# Patient Record
Sex: Female | Born: 1990 | Race: Black or African American | Hispanic: No | Marital: Single | Smoking: Former smoker
Health system: Southern US, Community
[De-identification: ages and names within clinical notes are randomized; demographics above are authoritative.]

## PROBLEM LIST (undated history)

## (undated) ENCOUNTER — Emergency Department (HOSPITAL_COMMUNITY): Admission: EM | Payer: Self-pay | Source: Home / Self Care

## (undated) ENCOUNTER — Inpatient Hospital Stay (HOSPITAL_COMMUNITY): Payer: Self-pay

## (undated) DIAGNOSIS — Z789 Other specified health status: Secondary | ICD-10-CM

## (undated) HISTORY — PX: MOUTH SURGERY: SHX715

---
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
## 2010-01-22 ENCOUNTER — Encounter (INDEPENDENT_AMBULATORY_CARE_PROVIDER_SITE_OTHER): Payer: Self-pay | Admitting: Nurse Practitioner

## 2010-01-30 ENCOUNTER — Ambulatory Visit: Payer: Self-pay | Admitting: Nurse Practitioner

## 2010-01-30 ENCOUNTER — Other Ambulatory Visit: Admission: RE | Admit: 2010-01-30 | Discharge: 2010-01-30 | Payer: Self-pay | Admitting: Internal Medicine

## 2010-01-30 LAB — CONVERTED CEMR LAB
Blood in Urine, dipstick: NEGATIVE
Ketones, urine, test strip: NEGATIVE
Nitrite: NEGATIVE
Protein, U semiquant: NEGATIVE
Specific Gravity, Urine: 1.015
Urobilinogen, UA: 1
WBC Urine, dipstick: NEGATIVE

## 2010-02-03 ENCOUNTER — Encounter (INDEPENDENT_AMBULATORY_CARE_PROVIDER_SITE_OTHER): Payer: Self-pay | Admitting: Nurse Practitioner

## 2010-02-03 DIAGNOSIS — D649 Anemia, unspecified: Secondary | ICD-10-CM

## 2010-02-03 LAB — CONVERTED CEMR LAB
Basophils Absolute: 0 10*3/uL (ref 0.0–0.1)
Chlamydia, DNA Probe: NEGATIVE
Eosinophils Relative: 1 % (ref 0–5)
GC Probe Amp, Genital: NEGATIVE
HCT: 35.8 % — ABNORMAL LOW (ref 36.0–46.0)
Hemoglobin: 12 g/dL (ref 12.0–15.0)
Lymphocytes Relative: 51 % — ABNORMAL HIGH (ref 12–46)
Lymphs Abs: 2.5 10*3/uL (ref 0.7–4.0)
Monocytes Absolute: 0.7 10*3/uL (ref 0.1–1.0)
Neutro Abs: 1.6 10*3/uL — ABNORMAL LOW (ref 1.7–7.7)
RBC: 5.6 M/uL — ABNORMAL HIGH (ref 3.87–5.11)
RDW: 15 % (ref 11.5–15.5)
WBC: 4.9 10*3/uL (ref 4.0–10.5)

## 2010-04-02 ENCOUNTER — Ambulatory Visit: Payer: Self-pay | Admitting: Nurse Practitioner

## 2010-04-02 DIAGNOSIS — B373 Candidiasis of vulva and vagina: Secondary | ICD-10-CM

## 2010-04-02 DIAGNOSIS — B3731 Acute candidiasis of vulva and vagina: Secondary | ICD-10-CM | POA: Insufficient documentation

## 2010-04-02 LAB — CONVERTED CEMR LAB: KOH Prep: NEGATIVE

## 2010-04-21 ENCOUNTER — Emergency Department (HOSPITAL_COMMUNITY): Admission: EM | Admit: 2010-04-21 | Discharge: 2010-04-21 | Payer: Self-pay | Admitting: Emergency Medicine

## 2010-07-28 NOTE — Letter (Signed)
Summary: IMMUNIZATION RECORD  IMMUNIZATION RECORD   Imported By: Arta Bruce 02/18/2010 14:50:39  _____________________________________________________________________  External Attachment:    Type:   Image     Comment:   External Document

## 2010-07-28 NOTE — Letter (Signed)
Summary: RECORDS RECEIVED/[facility]  RECORDS RECEIVED/[facility]   Imported By: Arta Bruce 01/22/2010 09:52:46  _____________________________________________________________________  External Attachment:    Type:   Image     Comment:   External Document

## 2010-07-28 NOTE — Assessment & Plan Note (Signed)
Summary: NEW - Establish care   Vital Signs:  Patient profile:   20 year old female LMP:     11/2009 Height:      70.25 inches Weight:      212.2 pounds BMI:     30.34 Temp:     98.2 degrees F oral Pulse rate:   62 / minute Pulse rhythm:   regular Resp:     16 per minute BP sitting:   101 / 66  (left arm) Cuff size:   regular  Vitals Entered By: Levon Hedger (December 31, 2009 3:05 PM) CC: new establish Is Patient Diabetic? No Pain Assessment Patient in pain? no       Does patient need assistance? Functional Status Self care Ambulation Normal LMP (date): 11/2009     Enter LMP: 11/2009   CC:  new establish.  History of Present Illness:  Pt into the office to establish care. Pt previously seen at [facility]  PMH - none PSH - none  Social - pt graduated from high school in 10/2008.  She has not been doing anything over the past year but trying to help her mother who has multiple illnesses (cva, diabetes, htn) 3rd of 5 children lives with mother, father is deceased  No CPE in over 1 year - need to get immunization record from [facility]  No acute issues today - only wanted to establish care today  Habits & Providers  Alcohol-Tobacco-Diet     Alcohol drinks/day: 0     Tobacco Status: never  Exercise-Depression-Behavior     Does Patient Exercise: no     Exercise Counseling: to improve exercise regimen     Drug Use: marijuanna     Drug Use Counseling: advised to quit  Medications Prior to Update: 1)  None  Allergies (verified): No Known Drug Allergies  Family History: mother - diabetes, htn, CVA father - (deceased - GSW) 5 siblings - all healthy  Social History: No children tobacco - none ETOH - none Drug - marijuanaSmoking Status:  never Drug Use:  marijuanna Does Patient Exercise:  no  Review of Systems General:  Complains of fatigue; denies fever; however pt admits that she is bored during the day so just sleeps because she does not  have anything else to do. Eyes:  Denies blurring. CV:  Denies chest pain or discomfort. Resp:  Denies cough. GI:  Denies abdominal pain, nausea, and vomiting.  Physical Exam  General:  alert.   Head:  normocephalic.   Lungs:  normal breath sounds.   Heart:  normal rate and regular rhythm.   Msk:  up to the exam table Neurologic:  alert & oriented X3 and gait normal.     Impression & Recommendations:  Problem # 1:  FATIGUE (ICD-780.79) advised pt that she needs to become active during the day perhaps this is the cause for fatigue will check labs on next visit will need to get chart from [facility] to determine what immunizations pt needs  Patient Instructions: 1)  NEW Patient - need to complete new patient package. 2)  Need chart from [facility] - sign a release of records 3)  Go to [facility] to see if you can enroll in classes.  You need to start school or start working so that you will have something to do during the day 4)  Schedule an appointment for a complete physical exam 5)  Do not eat before this appointment 6)  You will need fasting  labs, PAP, PHQ-9, u/a

## 2010-07-28 NOTE — Letter (Signed)
Summary: *HSN Results Follow up  [facility]  [address] [place], [place] [address]   Phone: [phone]  Fax: [phone]      02/03/2010   [facility] [address] [place], [place]  [address]   Dear  [name],                            ____S.Drinkard,FNP   ____D. Gore,FNP       ____B. McPherson,MD   ____V. Rankins,MD    ____E. Mulberry,MD    _X___N. Daphine Deutscher, FNP  ____D. Reche Dixon, MD    ____K. Philipp Deputy, MD    ____Other     This letter is to inform you that your recent test(s):  ___X____Pap Smear    ___X____Lab Test     _______X-ray    ___X____ is within acceptable limits  ___X____ requires a medication change    Comments:  Labs done during recent office visit shows that you are slightly anemic. You can take a multivitamin by mouth daily.  This is available over the counter.  Pap Smear results ________________________.      _________________________________________________________ If you have any questions, please contact our office [phone].                    Sincerely,    [name] FNP [facility]

## 2010-07-28 NOTE — Assessment & Plan Note (Signed)
Summary: Complete Physical Exam   Vital Signs:  Patient profile:   20 year old female Menstrual status:  regular LMP:     01/05/2010 Weight:      210.7 pounds BMI:     30.13 BSA:     2.14 Temp:     98.0 degrees F oral Pulse rate:   70 / minute Pulse rhythm:   regular Resp:     16 per minute BP sitting:   116 / 73  (left arm) Cuff size:   regular  Vitals Entered By: Levon Hedger (January 30, 2010 3:07 PM)  Nutrition Counseling: Patient's BMI is greater than 25 and therefore counseled on weight management options. CC: CPP Is Patient Diabetic? No Pain Assessment Patient in pain? yes     Location: back  Does patient need assistance? Functional Status Self care Ambulation Normal LMP (date): 01/05/2010 LMP - Character: normal     Menstrual Status regular Enter LMP: 01/05/2010   CC:  CPP.  History of Present Illness:  Pt into the office for a complete physical exam  PAP - Pt has never had a pap smear before. Pt is sexually active.  She uses condoms for birth control Menses - monthly.  Mammogram - no self breast exams at home remote family history of breast cancer but pt is unsure who - she will poll mother regarding family history  Dental - she maintains routine dental exams at [facility] every 6 months  Optho - no glasses or contacts.  She does have some problems with distance vision.    Habits & Providers  Alcohol-Tobacco-Diet     Alcohol drinks/day: 0     Tobacco Status: never  Exercise-Depression-Behavior     Does Patient Exercise: no     Exercise Counseling: to improve exercise regimen     Have you felt down or hopeless? no     Have you felt little pleasure in things? no     Drug Use: marijuanna  Comments: PHQ-9 score = 7  Medications Prior to Update: 1)  None  Allergies (verified): No Known Drug Allergies  Review of Systems General:  Denies fever. Eyes:  Denies blurring. ENT:  Denies earache. CV:  Denies chest pain or  discomfort. Resp:  Denies cough. GI:  Denies abdominal pain, nausea, and vomiting. GU:  Denies discharge. MS:  Denies joint pain. Derm:  Denies dryness. Neuro:  Denies headaches. Psych:  Denies anxiety and depression.  Physical Exam  General:  alert.   Head:  normocephalic.   Eyes:  pupils equal, pupils round, and pupils reactive to light.   Ears:  R ear normal and L ear normal.  bil TM with bony landmarks Nose:  no nasal discharge.   Mouth:  pharynx pink and moist and fair dentition.   Neck:  supple.   Breasts:  bil upper outer breast with dense tissue Lungs:  normal breath sounds.   Heart:  normal rate and regular rhythm.   Abdomen:  soft, non-tender, and normal bowel sounds.   Rectal:  defer Msk:  up to the exam table Pulses:  R radial normal and R dorsalis pedis normal.   Extremities:  no edema Neurologic:  alert & oriented X3.   Skin:  color normal.   Psych:  Oriented X3.    Pelvic Exam  Vulva:      normal appearance.   Urethra and Bladder:      Urethra--normal.   Vagina:      physiologic discharge.  Cervix:      midposition.   Uterus:      smooth.   Adnexa:      normal.      Impression & Recommendations:  Problem # 1:  ROUTINE GYNECOLOGICAL EXAMINATION (ICD-V72.31) PAP done rec optho and dental exam PHQ-9 score = 7 self breast exam placcard given Orders: KOH/ WET Mount [phone]) Pap Smear, Thin Prep ( Collection of) (U0454) UA Dipstick w/o Micro (manual) (09811) Rapid HIV  (92370)  Problem # 2:  SCREENING EXAMINATION FOR VENEREAL DISEASE (ICD-V74.5) advised pt about safe sex practices condoms given to pt Orders: T- GC Chlamydia (91478)  Problem # 3:  FATIGUE (ICD-780.79)  Orders: T-CBC w/Diff (29562-13086) Rapid HIV  (57846)  Other Orders: State-TD Vaccine 7 yrs. & > IM (96295M) State- HPV Vaccine/ 3 dose sch IM (84132G) Admin 1st Vaccine (40102) Admin of Any Addtl Vaccine (72536) Admin 1st Vaccine (State) (64403K) Admin of Any Addtl  Vaccine (State) (74259D)  Patient Instructions: 1)  Make an appointment for an eye exam 2)  Make an appointment for a dental exam 3)  Remember that only condom use can help protect against sexually transmitted disease.  You will be checked for these today. 4)  You have been given a tetanus today. It will be good for 10 years. 5)  Follow up in 2 months for Gardisil #2 - nurse visit 6)  Follow up in 6 months for Gardisil #3 - nurse visit  Immunization History:  Hepatitis B Immunization History:    Hepatitis B # 1:  historical (02/24/1995)    Hepatitis B # 2:  historical (04/01/2003)  DPT Immunization History:    DPT # 1:  historical (03/25/1992)    DPT # 2:  historical (05/30/1992)    DPT # 3:  historical (07/31/1992)    DPT # 4:  historical (09/30/1994)  HIB Immunization History:    HIB # 1:  historical (03/25/1992)    HIB # 2:  historical (05/30/1992)  Polio Immunization History:    Polio # 1:  historical (03/25/1992)    Polio # 2:  historical (05/30/1992)    Polio # 3:  historical (07/31/1992)  MMR Immunization History:    MMR # 1:  historical (05/30/1992)    MMR # 2:  mmrv (state) (02/24/1995)  Laboratory Results   Urine Tests  Date/Time Received: January 30, 2010 3:24 PM   Routine Urinalysis   Color: lt. yellow Appearance: Clear Glucose: negative   (Normal Range: Negative) Bilirubin: negative   (Normal Range: Negative) Ketone: negative   (Normal Range: Negative) Spec. Gravity: 1.015   (Normal Range: 1.003-1.035) Blood: negative   (Normal Range: Negative) pH: 7.5   (Normal Range: 5.0-8.0) Protein: negative   (Normal Range: Negative) Urobilinogen: 1.0   (Normal Range: 0-1) Nitrite: negative   (Normal Range: Negative) Leukocyte Esterace: negative   (Normal Range: Negative)    Date/Time Received: January 30, 2010 3:53 PM   Wet Mount/KOH Source: vaginal WBC/hpf: 1-5 Bacteria/hpf: rare Clue cells/hpf: none Yeast/hpf: none Trichomonas/hpf:  none   Laboratory Results   Urine Tests    Routine Urinalysis   Color: lt. yellow Appearance: Clear Glucose: negative   (Normal Range: Negative) Bilirubin: negative   (Normal Range: Negative) Ketone: negative   (Normal Range: Negative) Spec. Gravity: 1.015   (Normal Range: 1.003-1.035) Blood: negative   (Normal Range: Negative) pH: 7.5   (Normal Range: 5.0-8.0) Protein: negative   (Normal Range: Negative) Urobilinogen: 1.0   (Normal Range: 0-1) Nitrite:  negative   (Normal Range: Negative) Leukocyte Esterace: negative   (Normal Range: Negative)      Wet Mount Wet Mount KOH: Negative     Tetanus/Td Vaccine    Vaccine Type: Tdap (State)    Site: left deltoid    Mfr: GlaxoSmithKline    Dose: 0.5 ml    Route: IM    Given by: Levon Hedger    Exp. Date: 03/15/2011    Lot #: [id number]    VIS given: 05/16/07 version given January 30, 2010.  HPV # 1    Vaccine Type: Gardasil (State)    Site: right deltoid    Mfr: Merck    Dose: 0.5 ml    Route: IM    Given by: Levon Hedger    Exp. Date: 09/04/2011    Lot #: [id number]    VIS given: 07/30/05 version given January 30, 2010. 8469-6295-28 41324-401-02  Prevention & Chronic Care Immunizations   Influenza vaccine: Not documented    Tetanus booster: 01/30/2010: Tdap [facility])    Pneumococcal vaccine: Not documented  Other Screening   Pap smear: Not documented   Pap smear action/deferral: Ordered  (01/30/2010)   Pap smear due: 01/31/2011   Smoking status: never  (01/30/2010)

## 2010-07-28 NOTE — Letter (Signed)
Summary: Handout Printed  Printed Handout:  - Vaginitis-Brief 

## 2010-07-28 NOTE — Letter (Signed)
Summary: PT INFORMATION SHEET  PT INFORMATION SHEET   Imported By: Arta Bruce 01/01/2010 14:33:22  _____________________________________________________________________  External Attachment:    Type:   Image     Comment:   External Document

## 2010-07-28 NOTE — Progress Notes (Signed)
Summary: Office Visit/DEPRESSION SCREENING  Office Visit/DEPRESSION SCREENING   Imported By: Arta Bruce 02/18/2010 14:19:42  _____________________________________________________________________  External Attachment:    Type:   Image     Comment:   External Document

## 2010-09-09 LAB — URINALYSIS, ROUTINE W REFLEX MICROSCOPIC
Glucose, UA: NEGATIVE mg/dL
Ketones, ur: 15 mg/dL — AB
Nitrite: NEGATIVE
Protein, ur: NEGATIVE mg/dL
pH: 6 (ref 5.0–8.0)

## 2010-09-09 LAB — GC/CHLAMYDIA PROBE AMP, GENITAL
Chlamydia, DNA Probe: NEGATIVE
GC Probe Amp, Genital: NEGATIVE

## 2010-09-09 LAB — POCT PREGNANCY, URINE: Preg Test, Ur: NEGATIVE

## 2011-01-11 ENCOUNTER — Emergency Department (HOSPITAL_COMMUNITY)
Admission: EM | Admit: 2011-01-11 | Discharge: 2011-01-11 | Disposition: A | Discharge status: Home / Self Care | Payer: Medicaid Other | Attending: Emergency Medicine | Admitting: Emergency Medicine

## 2011-01-11 DIAGNOSIS — R112 Nausea with vomiting, unspecified: Secondary | ICD-10-CM | POA: Insufficient documentation

## 2011-01-11 DIAGNOSIS — R197 Diarrhea, unspecified: Secondary | ICD-10-CM | POA: Insufficient documentation

## 2011-01-11 DIAGNOSIS — R1013 Epigastric pain: Secondary | ICD-10-CM | POA: Insufficient documentation

## 2011-01-11 DIAGNOSIS — R109 Unspecified abdominal pain: Secondary | ICD-10-CM | POA: Insufficient documentation

## 2011-01-11 LAB — DIFFERENTIAL
Basophils Absolute: 0 10*3/uL (ref 0.0–0.1)
Eosinophils Absolute: 0 10*3/uL (ref 0.0–0.7)
Lymphs Abs: 0.9 10*3/uL (ref 0.7–4.0)
Monocytes Relative: 7 % (ref 3–12)
Neutro Abs: 3.4 10*3/uL (ref 1.7–7.7)

## 2011-01-11 LAB — CBC
Hemoglobin: 12.8 g/dL (ref 12.0–15.0)
MCH: 21.9 pg — ABNORMAL LOW (ref 26.0–34.0)
MCHC: 33.7 g/dL (ref 30.0–36.0)

## 2011-01-11 LAB — COMPREHENSIVE METABOLIC PANEL
ALT: 13 U/L (ref 0–35)
Calcium: 9.9 mg/dL (ref 8.4–10.5)
Creatinine, Ser: 0.71 mg/dL (ref 0.50–1.10)
GFR calc Af Amer: >60 mL/min (ref >60)
Glucose, Bld: 113 mg/dL — ABNORMAL HIGH (ref 70–99)
Sodium: 140 mEq/L (ref 135–145)
Total Protein: 8.5 g/dL — ABNORMAL HIGH (ref 6.0–8.3)

## 2011-01-11 LAB — URINE MICROSCOPIC-ADD ON

## 2011-01-11 LAB — URINALYSIS, ROUTINE W REFLEX MICROSCOPIC
Hgb urine dipstick: NEGATIVE
Leukocytes, UA: NEGATIVE
Nitrite: NEGATIVE
Specific Gravity, Urine: 1.029 (ref 1.005–1.030)
Urobilinogen, UA: 0.2 mg/dL (ref 0.0–1.0)

## 2011-01-11 LAB — LIPASE, BLOOD: Lipase: 24 U/L (ref 11–59)

## 2011-12-08 ENCOUNTER — Emergency Department (INDEPENDENT_AMBULATORY_CARE_PROVIDER_SITE_OTHER)
Admission: EM | Admit: 2011-12-08 | Discharge: 2011-12-08 | Disposition: A | Discharge status: Home / Self Care | Payer: Self-pay | Source: Home / Self Care | Attending: Emergency Medicine | Admitting: Emergency Medicine

## 2011-12-08 ENCOUNTER — Encounter (HOSPITAL_COMMUNITY): Payer: Self-pay | Admitting: *Deleted

## 2011-12-08 DIAGNOSIS — A499 Bacterial infection, unspecified: Secondary | ICD-10-CM

## 2011-12-08 DIAGNOSIS — N760 Acute vaginitis: Secondary | ICD-10-CM

## 2011-12-08 DIAGNOSIS — B9689 Other specified bacterial agents as the cause of diseases classified elsewhere: Secondary | ICD-10-CM

## 2011-12-08 LAB — POCT PREGNANCY, URINE: Preg Test, Ur: NEGATIVE

## 2011-12-08 LAB — POCT URINALYSIS DIP (DEVICE)
Glucose, UA: NEGATIVE mg/dL
Nitrite: NEGATIVE
Protein, ur: 30 mg/dL — AB
Urobilinogen, UA: 0.2 mg/dL (ref 0.0–1.0)

## 2011-12-08 LAB — WET PREP, GENITAL: Yeast Wet Prep HPF POC: NONE SEEN

## 2011-12-08 MED ORDER — CLINDAMYCIN HCL 150 MG PO CAPS: 300.0000 mg | ORAL_CAPSULE | Freq: Two times a day (BID) | ORAL | Status: AC

## 2011-12-08 MED ORDER — CLINDAMYCIN HCL 150 MG PO CAPS: 300.0000 mg | ORAL_CAPSULE | Freq: Two times a day (BID) | ORAL | Status: DC

## 2011-12-08 NOTE — ED Notes (Signed)
Pt reports vaginal discharge for the past several months.  She has gone to her Dr 3-4 times and was treated for BV and a yeast infection, but the discharge persists.   She denies fever/dysuria.

## 2011-12-08 NOTE — Discharge Instructions (Signed)
We will contact you if abnormal test results.   Bacterial Vaginosis Bacterial vaginosis (BV) is a vaginal infection where the normal balance of bacteria in the vagina is disrupted. The normal balance is then replaced by an overgrowth of certain bacteria. There are several different kinds of bacteria that can cause BV. BV is the most common vaginal infection in women of childbearing age. CAUSES   The cause of BV is not fully understood. BV develops when there is an increase or imbalance of harmful bacteria.   Some activities or behaviors can upset the normal balance of bacteria in the vagina and put women at increased risk including:   Having a new sex partner or multiple sex partners.   Douching.   Using an intrauterine device (IUD) for contraception.   It is not clear what role sexual activity plays in the development of BV. However, women that have never had sexual intercourse are rarely infected with BV.  Women do not get BV from toilet seats, bedding, swimming pools or from touching objects around them.  SYMPTOMS   Grey vaginal discharge.   A fish-like odor with discharge, especially after sexual intercourse.   Itching or burning of the vagina and vulva.   Burning or pain with urination.   Some women have no signs or symptoms at all.  DIAGNOSIS  Your caregiver must examine the vagina for signs of BV. Your caregiver will perform lab tests and look at the sample of vaginal fluid through a microscope. They will look for bacteria and abnormal cells (clue cells), a pH test higher than 4.5, and a positive amine test all associated with BV.  RISKS AND COMPLICATIONS   Pelvic inflammatory disease (PID).   Infections following gynecology surgery.   Developing HIV.   Developing herpes virus.  TREATMENT  Sometimes BV will clear up without treatment. However, all women with symptoms of BV should be treated to avoid complications, especially if gynecology surgery is planned. Female  partners generally do not need to be treated. However, BV may spread between female sex partners so treatment is helpful in preventing a recurrence of BV.   BV may be treated with antibiotics. The antibiotics come in either pill or vaginal cream forms. Either can be used with nonpregnant or pregnant women, but the recommended dosages differ. These antibiotics are not harmful to the baby.   BV can recur after treatment. If this happens, a second round of antibiotics will often be prescribed.   Treatment is important for pregnant women. If not treated, BV can cause a premature delivery, especially for a pregnant woman who had a premature birth in the past. All pregnant women who have symptoms of BV should be checked and treated.   For chronic reoccurrence of BV, treatment with a type of prescribed gel vaginally twice a week is helpful.  HOME CARE INSTRUCTIONS   Finish all medication as directed by your caregiver.   Do not have sex until treatment is completed.   Tell your sexual partner that you have a vaginal infection. They should see their caregiver and be treated if they have problems, such as a mild rash or itching.   Practice safe sex. Use condoms. Only have 1 sex partner.  PREVENTION  Basic prevention steps can help reduce the risk of upsetting the natural balance of bacteria in the vagina and developing BV:  Do not have sexual intercourse (be abstinent).   Do not douche.   Use all of the medicine prescribed for treatment  of BV, even if the signs and symptoms go away.   Tell your sex partner if you have BV. That way, they can be treated, if needed, to prevent reoccurrence.  SEEK MEDICAL CARE IF:   Your symptoms are not improving after 3 days of treatment.   You have increased discharge, pain, or fever.  MAKE SURE YOU:   Understand these instructions.   Will watch your condition.   Will get help right away if you are not doing well or get worse.  FOR MORE INFORMATION    Division of STD Prevention (DSTDP), Centers for Disease Control and Prevention: [employer] American Social Health Association (ASHA): www.ashastd.org  Document Released: 06/14/2005 Document Revised: 06/03/2011 Document Reviewed: 12/05/2008 [facility] Patient Information 2012 [place], [place].

## 2011-12-08 NOTE — ED Provider Notes (Signed)
History     CSN: [id number]  Arrival date & time 12/08/11  1238   First MD Initiated Contact with Patient 12/08/11 1408      Chief Complaint  Patient presents with  . Vaginal Discharge    (Consider location/radiation/quality/duration/timing/severity/associated sxs/prior treatment) Patient is a 21 y.o. female presenting with vaginal discharge. The history is provided by the patient.  Vaginal Discharge This is a recurrent problem. The problem occurs daily. The problem has not changed since onset.Pertinent negatives include no abdominal pain and no shortness of breath. Nothing aggravates the symptoms. Treatments tried: antibiotics ?    History reviewed. No pertinent past medical history.  History reviewed. No pertinent past surgical history.  Family History  Problem Relation Age of Onset  . Diabetes Mother     History  Substance Use Topics  . Smoking status: Former Games developer  . Smokeless tobacco: Not on file  . Alcohol Use: Yes     occasionally    OB History    Grav Para Term Preterm Abortions TAB SAB Ect Mult Living                  Review of Systems  Constitutional: Negative for fever, chills, activity change, appetite change and fatigue.  Respiratory: Negative for cough and shortness of breath.   Gastrointestinal: Negative for abdominal pain.  Genitourinary: Positive for vaginal discharge. Negative for dysuria, frequency, enuresis and vaginal pain.  Skin: Negative for rash.    Allergies  Review of patient's allergies indicates no known allergies.  Home Medications   Current Outpatient Rx  Name Route Sig Dispense Refill  . CLINDAMYCIN HCL 150 MG PO CAPS Oral Take 2 capsules (300 mg total) by mouth 2 (two) times daily. 14 capsule 0    BP 125/70  Pulse 66  Temp 98.1 F (36.7 C) (Oral)  Resp 16  SpO2 100%  LMP 11/21/2011  Physical Exam  Nursing note and vitals reviewed. Constitutional: She appears well-developed and well-nourished.  Abdominal: Soft.  There is no tenderness.  Genitourinary: Cervix exhibits discharge. Cervix exhibits no motion tenderness and no friability. Right adnexum displays no tenderness. Left adnexum displays no tenderness. No erythema, tenderness or bleeding around the vagina. No foreign body around the vagina. No signs of injury around the vagina. Vaginal discharge found.    Neurological: She is alert.    ED Course  Procedures (including critical care time)  Labs Reviewed  POCT URINALYSIS DIP (DEVICE) - Abnormal; Notable for the following:    pH 8.5 (*)     Protein, ur 30 (*)     All other components within normal limits  WET PREP, GENITAL - Abnormal; Notable for the following:    WBC, Wet Prep HPF POC FEW (*)     All other components within normal limits  POCT PREGNANCY, URINE  GC/CHLAMYDIA PROBE AMP, GENITAL  WET PREP, GENITAL  GC/CHLAMYDIA PROBE AMP, GENITAL   No results found.   1. Bacterial vaginosis       MDM  Recurrent vaginitis- symptomatic for 4 months, patient describes two cycles of antibiotic that starts with a  F, she thinks, she can remember what was she diagnosed with? Discussed empirical treatment with clindamycin, she agreed. She expresses no concerns for an STD exposure.        Jimmie Molly, MD 12/08/11 [phone]

## 2011-12-08 NOTE — ED Notes (Signed)
Attempted  To  Reach  Pt  To  Verify  That  The  Correct RX and  Instructions  Were  Given  Message  Left at  Home     And    The  Pt  Has  No  Cell phone

## 2011-12-29 ENCOUNTER — Telehealth (HOSPITAL_COMMUNITY): Payer: Self-pay | Admitting: *Deleted

## 2011-12-29 NOTE — ED Notes (Signed)
Pt. called and said the Clindamycin did not work.  I asked what are her sx.'s?  She said she is itching and has a thick white clumpy discharge.  I told her I would call back.  Discussed with Dr. Ladon Applebaum and he wrote order Diflucan 100 mg. PO x 1 dose. Come back for recheck if this does not clear it up.  I called back and left a message to call. Vassie Moselle 12/29/2011

## 2011-12-30 ENCOUNTER — Telehealth (HOSPITAL_COMMUNITY): Payer: Self-pay | Admitting: *Deleted

## 2011-12-30 NOTE — ED Notes (Signed)
Discussed dosage with Dr. Tressia Danas and she increased it to Diflucan 150 mg. PO x 1.  Pt. Called on VM @ 0915 and said she had to work until 1700. She asked me to call @ 1745. I called and 1730 and 1748 and left messages for pt. to call tomorrow after 1130. Vassie Moselle 12/30/2011

## 2012-01-05 ENCOUNTER — Telehealth (HOSPITAL_COMMUNITY): Payer: Self-pay | Admitting: *Deleted

## 2012-01-05 NOTE — ED Notes (Signed)
Pt. called back. I told her Dr. Ladon Applebaum has prescribed Diflucan 150 mg. Po x 1 dose but I did not know what pharmacy to call it into.  I told her I had tried calling her back 3 x and could not reach her.  States she is still having symptoms and wants Rx. Called to [facility] on [address]  Rx. Called to pharmacist @ [phone]. Vassie Moselle 01/05/2012

## 2012-01-06 MED ORDER — FLUCONAZOLE 150 MG PO TABS: 200.0000 mg | ORAL_TABLET | Freq: Once | ORAL | Status: DC

## 2012-01-10 ENCOUNTER — Telehealth (HOSPITAL_COMMUNITY): Payer: Self-pay | Admitting: *Deleted

## 2012-01-10 NOTE — ED Notes (Signed)
Pt. called and said she took the Diflucan Sat. AM and still has white clumpy discharge. I told her sometimes it can take up to 72 hrs to clear it up. I suggested she try getting some OTC Monistat or store brand yeast medication and if it does not help, she should come back and be rechecked. Pt. said OK but did not sound confident. Adrienne Walker 01/10/2012

## 2012-01-13 MED ORDER — FLUCONAZOLE 150 MG PO TABS: 150.0000 mg | ORAL_TABLET | Freq: Once | ORAL | Status: AC

## 2012-03-21 ENCOUNTER — Encounter (HOSPITAL_COMMUNITY): Payer: Self-pay | Admitting: *Deleted

## 2012-03-21 ENCOUNTER — Emergency Department (HOSPITAL_COMMUNITY)
Admission: EM | Admit: 2012-03-21 | Discharge: 2012-03-21 | Disposition: A | Discharge status: Home / Self Care | Payer: Self-pay | Attending: Emergency Medicine | Admitting: Emergency Medicine

## 2012-03-21 DIAGNOSIS — Z833 Family history of diabetes mellitus: Secondary | ICD-10-CM | POA: Insufficient documentation

## 2012-03-21 DIAGNOSIS — N898 Other specified noninflammatory disorders of vagina: Secondary | ICD-10-CM

## 2012-03-21 DIAGNOSIS — Z87891 Personal history of nicotine dependence: Secondary | ICD-10-CM | POA: Insufficient documentation

## 2012-03-21 LAB — URINALYSIS, ROUTINE W REFLEX MICROSCOPIC
Ketones, ur: 40 mg/dL — AB
Nitrite: NEGATIVE
Protein, ur: NEGATIVE mg/dL
Urobilinogen, UA: 1 mg/dL (ref 0.0–1.0)

## 2012-03-21 LAB — WET PREP, GENITAL: Clue Cells Wet Prep HPF POC: NONE SEEN

## 2012-03-21 LAB — URINE MICROSCOPIC-ADD ON

## 2012-03-21 MED ORDER — CEFTRIAXONE SODIUM 250 MG IJ SOLR
250.0000 mg | Freq: Once | INTRAMUSCULAR | Status: AC
  Administered 2012-03-21: 250 mg via INTRAMUSCULAR
  Filled 2012-03-21: qty 250

## 2012-03-21 MED ORDER — FLUCONAZOLE 200 MG PO TABS: 200.0000 mg | ORAL_TABLET | Freq: Every day | ORAL | Status: AC

## 2012-03-21 MED ORDER — LIDOCAINE HCL 1 % IJ SOLN
INTRAMUSCULAR | Status: AC
  Administered 2012-03-21: 23:00:00
  Filled 2012-03-21: qty 20

## 2012-03-21 MED ORDER — AZITHROMYCIN 250 MG PO TABS
500.0000 mg | ORAL_TABLET | Freq: Once | ORAL | Status: AC
  Administered 2012-03-21: 500 mg via ORAL
  Filled 2012-03-21: qty 2

## 2012-03-21 NOTE — ED Provider Notes (Signed)
Medical screening examination/treatment/procedure(s) were performed by non-physician practitioner and as supervising physician I was immediately available for consultation/collaboration.   Larell Baney, MD 03/21/12 2359 

## 2012-03-21 NOTE — ED Provider Notes (Signed)
History     CSN: [id number]  Arrival date & time 03/21/12  1541   First MD Initiated Contact with Patient 03/21/12 2100      Chief Complaint  Patient presents with  . Vaginal Discharge    (Consider location/radiation/quality/duration/timing/severity/associated sxs/prior treatment) HPI  Pt comes to the ER concerned about vaginal discharge without bleeding, itching or pain. She admits to being treated multiple times for yeast infection and BV but nothing has alleviated her symptoms. She is here because she is frustrated about the discharge and wants to get looked at again. LMP 1 month ago. No other symptoms.  History reviewed. No pertinent past medical history.  History reviewed. No pertinent past surgical history.  Family History  Problem Relation Age of Onset  . Diabetes Mother     History  Substance Use Topics  . Smoking status: Former Games developer  . Smokeless tobacco: Never Used  . Alcohol Use: Yes     occasionally    OB History    Grav Para Term Preterm Abortions TAB SAB Ect Mult Living                  Review of Systems   Review of Systems  Gen: no weight loss, fevers, chills, night sweats  Eyes: no discharge or drainage, no occular pain or visual changes  Nose: no epistaxis or rhinorrhea  Mouth: no dental pain, no sore throat  Neck: no neck pain  Lungs:No wheezing, coughing or hemoptysis CV: no chest pain, palpitations, dependent edema or orthopnea  Abd: no abdominal pain, nausea, vomiting  GU: no dysuria or gross hematuria, + white vaginal discharge MSK:  No abnormalities  Neuro: no headache, no focal neurologic deficits  Skin: no abnormalities Psyche: negative.    Allergies  Review of patient's allergies indicates no known allergies.  Home Medications  No current outpatient prescriptions on file.  BP 124/70  Pulse 71  Temp 98.7 F (37.1 C) (Oral)  Resp 18  SpO2 100%  LMP 02/20/2012  Physical Exam  Nursing note and vitals  reviewed. Constitutional: She appears well-developed and well-nourished. No distress.  HENT:  Head: Normocephalic and atraumatic.  Eyes: Pupils are equal, round, and reactive to light.  Neck: Normal range of motion. Neck supple.  Cardiovascular: Normal rate and regular rhythm.   Pulmonary/Chest: Effort normal.  Abdominal: Soft.  Genitourinary: Uterus normal. Cervix exhibits no motion tenderness, no discharge and no friability. No erythema, tenderness or bleeding around the vagina. No foreign body around the vagina. No signs of injury around the vagina. Vaginal discharge found.  Neurological: She is alert.  Skin: Skin is warm and dry.    ED Course  Procedures (including critical care time)  Labs Reviewed  WET PREP, GENITAL - Abnormal; Notable for the following:    Yeast Wet Prep HPF POC RARE (*)     WBC, Wet Prep HPF POC RARE (*)     All other components within normal limits  URINALYSIS, ROUTINE W REFLEX MICROSCOPIC - Abnormal; Notable for the following:    APPearance CLOUDY (*)     Ketones, ur 40 (*)     Leukocytes, UA TRACE (*)     All other components within normal limits  URINE MICROSCOPIC-ADD ON - Abnormal; Notable for the following:    Squamous Epithelial / LPF MANY (*)     Bacteria, UA FEW (*)     All other components within normal limits  PREGNANCY, URINE  GC/CHLAMYDIA PROBE AMP, GENITAL   No results  found.   1. Vaginal Discharge       MDM  Pt has rare yeast and rare WBC, no clue cells. I will treat for GC and yeast infection since she has been treated for BV and it did not help.  Pt has been advised of the symptoms that warrant their return to the ED. Patient has voiced understanding and has agreed to follow-up with the PCP or specialist.         Dorthula Matas, PA 03/21/12 2211

## 2012-03-21 NOTE — ED Notes (Signed)
Pt states she's had a white discharge x months, has been to doctor multiple times about it, was told it was a yeast infection or VB, the medications she has been given have not helped. Denies UTI symptoms, denies abdominal pain.

## 2012-03-22 LAB — GC/CHLAMYDIA PROBE AMP, GENITAL
Chlamydia, DNA Probe: NEGATIVE
GC Probe Amp, Genital: NEGATIVE

## 2012-04-12 ENCOUNTER — Emergency Department (HOSPITAL_COMMUNITY)
Admission: EM | Admit: 2012-04-12 | Discharge: 2012-04-12 | Disposition: A | Discharge status: Home / Self Care | Payer: Self-pay | Attending: Emergency Medicine | Admitting: Emergency Medicine

## 2012-04-12 ENCOUNTER — Encounter (HOSPITAL_COMMUNITY): Payer: Self-pay | Admitting: Emergency Medicine

## 2012-04-12 DIAGNOSIS — Z87891 Personal history of nicotine dependence: Secondary | ICD-10-CM | POA: Insufficient documentation

## 2012-04-12 DIAGNOSIS — N760 Acute vaginitis: Secondary | ICD-10-CM | POA: Insufficient documentation

## 2012-04-12 DIAGNOSIS — N898 Other specified noninflammatory disorders of vagina: Secondary | ICD-10-CM

## 2012-04-12 DIAGNOSIS — B9689 Other specified bacterial agents as the cause of diseases classified elsewhere: Secondary | ICD-10-CM | POA: Insufficient documentation

## 2012-04-12 DIAGNOSIS — A499 Bacterial infection, unspecified: Secondary | ICD-10-CM | POA: Insufficient documentation

## 2012-04-12 LAB — URINALYSIS, ROUTINE W REFLEX MICROSCOPIC
Glucose, UA: NEGATIVE mg/dL
Leukocytes, UA: NEGATIVE
Protein, ur: NEGATIVE mg/dL
Specific Gravity, Urine: 1.022 (ref 1.005–1.030)

## 2012-04-12 LAB — WET PREP, GENITAL: Yeast Wet Prep HPF POC: NONE SEEN

## 2012-04-12 LAB — PREGNANCY, URINE: Preg Test, Ur: NEGATIVE

## 2012-04-12 MED ORDER — METRONIDAZOLE 500 MG PO TABS: 500.0000 mg | ORAL_TABLET | Freq: Two times a day (BID) | ORAL | Status: DC

## 2012-04-12 NOTE — ED Notes (Signed)
Pt requests a female physician to do her pelvic.

## 2012-04-12 NOTE — ED Provider Notes (Signed)
History     CSN: [id number]  Arrival date & time 04/12/12  1322   First MD Initiated Contact with Patient 04/12/12 1346      Chief Complaint  Patient presents with  . Vaginal Itching    (Consider location/radiation/quality/duration/timing/severity/associated sxs/prior treatment) HPI Comments: Pt complains of vaginal itching, burning and discharge.  Has been treated with monistat and one week of diflucan.  Says that it got better, now is coming back again.  Denies abdominal pain.  No n/v.  Normal menstrual periods  Patient is a 21 y.o. female presenting with vaginal itching. The history is provided by the patient.  Vaginal Itching This is a recurrent problem. Episode onset: 2 months ago. The problem occurs constantly. The problem has been gradually worsening. Pertinent negatives include no chest pain, no abdominal pain, no headaches and no shortness of breath. Nothing aggravates the symptoms. Nothing relieves the symptoms.    History reviewed. No pertinent past medical history.  History reviewed. No pertinent past surgical history.  Family History  Problem Relation Age of Onset  . Diabetes Mother     History  Substance Use Topics  . Smoking status: Former Games developer  . Smokeless tobacco: Never Used  . Alcohol Use: No     occasionally    OB History    Grav Para Term Preterm Abortions TAB SAB Ect Mult Living                  Review of Systems  Constitutional: Negative for fever, chills, diaphoresis and fatigue.  HENT: Negative for congestion, rhinorrhea and sneezing.   Eyes: Negative.   Respiratory: Negative for cough, chest tightness and shortness of breath.   Cardiovascular: Negative for chest pain and leg swelling.  Gastrointestinal: Negative for nausea, vomiting, abdominal pain, diarrhea and blood in stool.  Genitourinary: Positive for vaginal discharge. Negative for frequency, hematuria, flank pain and difficulty urinating.  Musculoskeletal: Negative for back pain  and arthralgias.  Skin: Negative for rash.  Neurological: Negative for dizziness, speech difficulty, weakness, numbness and headaches.    Allergies  Review of patient's allergies indicates no known allergies.  Home Medications   Current Outpatient Rx  Name Route Sig Dispense Refill  . METRONIDAZOLE 500 MG PO TABS Oral Take 1 tablet (500 mg total) by mouth 2 (two) times daily. 14 tablet 0    BP 125/63  Pulse 74  Temp 97.9 F (36.6 C) (Oral)  Resp 16  SpO2 99%  LMP 03/23/2012  Physical Exam  Constitutional: She is oriented to person, place, and time. She appears well-developed and well-nourished.  HENT:  Head: Normocephalic and atraumatic.  Eyes: Pupils are equal, round, and reactive to light.  Neck: Normal range of motion. Neck supple.  Cardiovascular: Normal rate, regular rhythm and normal heart sounds.   Pulmonary/Chest: Effort normal and breath sounds normal. No respiratory distress. She has no wheezes. She has no rales. She exhibits no tenderness.  Abdominal: Soft. Bowel sounds are normal. There is no tenderness. There is no rebound and no guarding.  Genitourinary: Vaginal discharge (slight white discharge, no CMT, no adnexal tenderness) found.  Musculoskeletal: Normal range of motion. She exhibits no edema.  Lymphadenopathy:    She has no cervical adenopathy.  Neurological: She is alert and oriented to person, place, and time.  Skin: Skin is warm and dry. No rash noted.  Psychiatric: She has a normal mood and affect.    ED Course  Procedures (including critical care time)  Results for orders placed  during the hospital encounter of 04/12/12  URINALYSIS, ROUTINE W REFLEX MICROSCOPIC      Component Value Range   Color, Urine YELLOW  YELLOW   APPearance CLEAR  CLEAR   Specific Gravity, Urine 1.022  1.005 - 1.030   pH 7.0  5.0 - 8.0   Glucose, UA NEGATIVE  NEGATIVE mg/dL   Hgb urine dipstick NEGATIVE  NEGATIVE   Bilirubin Urine NEGATIVE  NEGATIVE   Ketones, ur  NEGATIVE  NEGATIVE mg/dL   Protein, ur NEGATIVE  NEGATIVE mg/dL   Urobilinogen, UA 0.2  0.0 - 1.0 mg/dL   Nitrite NEGATIVE  NEGATIVE   Leukocytes, UA NEGATIVE  NEGATIVE  PREGNANCY, URINE      Component Value Range   Preg Test, Ur NEGATIVE  NEGATIVE  WET PREP, GENITAL      Component Value Range   Yeast Wet Prep HPF POC NONE SEEN  NONE SEEN   Trich, Wet Prep NONE SEEN  NONE SEEN   Clue Cells Wet Prep HPF POC FEW (*) NONE SEEN   WBC, Wet Prep HPF POC FEW (*) NONE SEEN   No results found.    1. Vaginal discharge       MDM  Pt with BV (possible, only few clue cells).  Was just treated for GC/Chlamydia and had neg labs for this.  Neg for yeast.  Will tx for BV, but gave pt resource guide and encouraged f/u with ob/gyn or PCP        Rolan Bucco, MD 04/12/12 1456

## 2012-04-12 NOTE — ED Notes (Signed)
Discharge instructions reviewed. Pt verbalized understanding.  

## 2012-04-12 NOTE — ED Notes (Signed)
Pt states that she keeps having recurring yeast infections. They clear up for a while and then come back. She states she came to the ER two weeks ago for treatment for this problem and had no relief from the symptoms. She tried monostat over the counter and some home remedies and had no relief.

## 2014-08-01 ENCOUNTER — Inpatient Hospital Stay (HOSPITAL_COMMUNITY): Payer: Self-pay

## 2014-08-01 ENCOUNTER — Inpatient Hospital Stay (HOSPITAL_COMMUNITY)
Admission: AD | Admit: 2014-08-01 | Discharge: 2014-08-01 | Disposition: A | Discharge status: Home / Self Care | Payer: Self-pay | Source: Ambulatory Visit | Attending: Obstetrics & Gynecology | Admitting: Obstetrics & Gynecology

## 2014-08-01 ENCOUNTER — Encounter (HOSPITAL_COMMUNITY): Payer: Self-pay

## 2014-08-01 DIAGNOSIS — O9989 Other specified diseases and conditions complicating pregnancy, childbirth and the puerperium: Secondary | ICD-10-CM

## 2014-08-01 DIAGNOSIS — O3410 Maternal care for benign tumor of corpus uteri, unspecified trimester: Secondary | ICD-10-CM

## 2014-08-01 DIAGNOSIS — Z87891 Personal history of nicotine dependence: Secondary | ICD-10-CM | POA: Insufficient documentation

## 2014-08-01 DIAGNOSIS — R109 Unspecified abdominal pain: Secondary | ICD-10-CM | POA: Insufficient documentation

## 2014-08-01 DIAGNOSIS — O3411 Maternal care for benign tumor of corpus uteri, first trimester: Secondary | ICD-10-CM | POA: Insufficient documentation

## 2014-08-01 DIAGNOSIS — Z3A01 Less than 8 weeks gestation of pregnancy: Secondary | ICD-10-CM | POA: Insufficient documentation

## 2014-08-01 DIAGNOSIS — D259 Leiomyoma of uterus, unspecified: Secondary | ICD-10-CM | POA: Insufficient documentation

## 2014-08-01 DIAGNOSIS — O26851 Spotting complicating pregnancy, first trimester: Secondary | ICD-10-CM | POA: Insufficient documentation

## 2014-08-01 DIAGNOSIS — O26899 Other specified pregnancy related conditions, unspecified trimester: Secondary | ICD-10-CM

## 2014-08-01 DIAGNOSIS — O23591 Infection of other part of genital tract in pregnancy, first trimester: Secondary | ICD-10-CM | POA: Insufficient documentation

## 2014-08-01 DIAGNOSIS — N760 Acute vaginitis: Secondary | ICD-10-CM | POA: Insufficient documentation

## 2014-08-01 DIAGNOSIS — B9689 Other specified bacterial agents as the cause of diseases classified elsewhere: Secondary | ICD-10-CM | POA: Insufficient documentation

## 2014-08-01 HISTORY — DX: Other specified health status: Z78.9

## 2014-08-01 LAB — URINALYSIS, ROUTINE W REFLEX MICROSCOPIC
Bilirubin Urine: NEGATIVE
Glucose, UA: NEGATIVE mg/dL
Ketones, ur: NEGATIVE mg/dL
Leukocytes, UA: NEGATIVE
NITRITE: NEGATIVE
PH: 6 (ref 5.0–8.0)
Protein, ur: NEGATIVE mg/dL
SPECIFIC GRAVITY, URINE: 1.015 (ref 1.005–1.030)
Urobilinogen, UA: 0.2 mg/dL (ref 0.0–1.0)

## 2014-08-01 LAB — CBC WITH DIFFERENTIAL/PLATELET
BASOS PCT: 0 % (ref 0–1)
Basophils Absolute: 0 10*3/uL (ref 0.0–0.1)
EOS ABS: 0 10*3/uL (ref 0.0–0.7)
Eosinophils Relative: 1 % (ref 0–5)
HCT: 37.3 % (ref 36.0–46.0)
Hemoglobin: 13.1 g/dL (ref 12.0–15.0)
LYMPHS PCT: 35 % (ref 12–46)
Lymphs Abs: 2.7 10*3/uL (ref 0.7–4.0)
MCH: 23.1 pg — AB (ref 26.0–34.0)
MCHC: 35.1 g/dL (ref 30.0–36.0)
MCV: 65.8 fL — ABNORMAL LOW (ref 78.0–100.0)
MONOS PCT: 9 % (ref 3–12)
Monocytes Absolute: 0.7 10*3/uL (ref 0.1–1.0)
Neutro Abs: 4.2 10*3/uL (ref 1.7–7.7)
Neutrophils Relative %: 55 % (ref 43–77)
Platelets: 193 10*3/uL (ref 150–400)
RBC: 5.67 MIL/uL — AB (ref 3.87–5.11)
RDW: 14.8 % (ref 11.5–15.5)
WBC: 7.6 10*3/uL (ref 4.0–10.5)

## 2014-08-01 LAB — GC/CHLAMYDIA PROBE AMP (~~LOC~~) NOT AT ARMC
Chlamydia: NEGATIVE
NEISSERIA GONORRHEA: NEGATIVE

## 2014-08-01 LAB — URINE MICROSCOPIC-ADD ON

## 2014-08-01 LAB — ABO/RH: ABO/RH(D): O POS

## 2014-08-01 LAB — HCG, QUANTITATIVE, PREGNANCY: hCG, Beta Chain, Quant, S: 116 m[IU]/mL — ABNORMAL HIGH (ref <5)

## 2014-08-01 LAB — WET PREP, GENITAL
Trich, Wet Prep: NONE SEEN
Yeast Wet Prep HPF POC: NONE SEEN

## 2014-08-01 LAB — HIV ANTIBODY (ROUTINE TESTING W REFLEX): HIV Screen 4th Generation wRfx: NONREACTIVE

## 2014-08-01 LAB — POCT PREGNANCY, URINE: Preg Test, Ur: NEGATIVE

## 2014-08-01 MED ORDER — METRONIDAZOLE 500 MG PO TABS: 500.0000 mg | ORAL_TABLET | Freq: Two times a day (BID) | ORAL | Status: DC

## 2014-08-01 NOTE — MAU Provider Note (Signed)
History     CSN: [id number]  Arrival date and time: 08/01/14 0029   First Provider Initiated Contact with Patient 08/01/14 0055      Chief Complaint  Patient presents with  . Nausea  . Abdominal Pain   HPI  [name] is a 24 y.o. G1P0000 at [redacted]w[redacted]d who presents to MAU today with complaint of +HPT, abdominal pain and spotting. The patient states abdominal pain off and on since 07/25/14. She denies pain today. She also states spotting since ~ 07/25/14. She denies heavy vaginal bleeding, fever, N/V/D/ or constipation or UTI symptoms.   OB History    Gravida Para Term Preterm AB TAB SAB Ectopic Multiple Living   1 0 0 0 0 0 0 0 0 0       Past Medical History  Diagnosis Date  . Medical history non-contributory     Past Surgical History  Procedure Laterality Date  . No past surgeries      Family History  Problem Relation Age of Onset  . Diabetes Mother     History  Substance Use Topics  . Smoking status: Former Smoker    Quit date: 07/31/2014  . Smokeless tobacco: Never Used  . Alcohol Use: No     Comment: occasionally    Allergies: No Known Allergies  No prescriptions prior to admission    Review of Systems  Constitutional: Negative for fever and malaise/fatigue.  Gastrointestinal: Negative for nausea, vomiting, abdominal pain, diarrhea and constipation.  Genitourinary: Negative for dysuria, urgency and frequency.       + vaginal bleeding, vaginal discharge   Physical Exam   Blood pressure 117/72, pulse 83, temperature 99.4 F (37.4 C), temperature source Oral, resp. rate 16, height 5\' 11"  (1.803 m), weight 248 lb 6.4 oz (112.674 kg), last menstrual period 06/28/2014, SpO2 100 %.  Physical Exam  Constitutional: She is oriented to person, place, and time. She appears well-developed and well-nourished. No distress.  HENT:  Head: Normocephalic.  Cardiovascular: Normal rate.   Respiratory: Effort normal.  GI: Soft. Bowel sounds are normal. She exhibits  no distension and no mass. There is no tenderness. There is no rebound and no guarding.  Genitourinary: Uterus is not enlarged and not tender. Cervix exhibits no motion tenderness, no discharge and no friability. Right adnexum displays no mass and no tenderness. Left adnexum displays no mass and no tenderness. There is bleeding (scant brown) in the vagina. Vaginal discharge (small amount of thin, brown discharge noted) found.  Neurological: She is oriented to person, place, and time.  Skin: Skin is warm and dry. No erythema.  Psychiatric: She has a normal mood and affect.   Results for orders placed or performed during the hospital encounter of 08/01/14 (from the past 24 hour(s))  Urinalysis, Routine w reflex microscopic     Status: Abnormal   Collection Time: 08/01/14 12:43 AM  Result Value Ref Range   Color, Urine YELLOW YELLOW   APPearance CLEAR CLEAR   Specific Gravity, Urine 1.015 1.005 - 1.030   pH 6.0 5.0 - 8.0   Glucose, UA NEGATIVE NEGATIVE mg/dL   Hgb urine dipstick TRACE (A) NEGATIVE   Bilirubin Urine NEGATIVE NEGATIVE   Ketones, ur NEGATIVE NEGATIVE mg/dL   Protein, ur NEGATIVE NEGATIVE mg/dL   Urobilinogen, UA 0.2 0.0 - 1.0 mg/dL   Nitrite NEGATIVE NEGATIVE   Leukocytes, UA NEGATIVE NEGATIVE  Urine microscopic-add on     Status: Abnormal   Collection Time: 08/01/14 12:43 AM  Result  Value Ref Range   Squamous Epithelial / LPF FEW (A) RARE   WBC, UA 0-2 <3 WBC/hpf   RBC / HPF 0-2 <3 RBC/hpf   Bacteria, UA FEW (A) RARE  Pregnancy, urine POC     Status: None   Collection Time: 08/01/14 12:58 AM  Result Value Ref Range   Preg Test, Ur NEGATIVE NEGATIVE  CBC with Differential/Platelet     Status: Abnormal   Collection Time: 08/01/14  1:08 AM  Result Value Ref Range   WBC 7.6 4.0 - 10.5 K/uL   RBC 5.67 (H) 3.87 - 5.11 MIL/uL   Hemoglobin 13.1 12.0 - 15.0 g/dL   HCT 37.3 36.0 - 46.0 %   MCV 65.8 (L) 78.0 - 100.0 fL   MCH 23.1 (L) 26.0 - 34.0 pg   MCHC 35.1 30.0 - 36.0  g/dL   RDW 14.8 11.5 - 15.5 %   Platelets 193 150 - 400 K/uL   Neutrophils Relative % 55 43 - 77 %   Neutro Abs 4.2 1.7 - 7.7 K/uL   Lymphocytes Relative 35 12 - 46 %   Lymphs Abs 2.7 0.7 - 4.0 K/uL   Monocytes Relative 9 3 - 12 %   Monocytes Absolute 0.7 0.1 - 1.0 K/uL   Eosinophils Relative 1 0 - 5 %   Eosinophils Absolute 0.0 0.0 - 0.7 K/uL   Basophils Relative 0 0 - 1 %   Basophils Absolute 0.0 0.0 - 0.1 K/uL  ABO/Rh     Status: None (Preliminary result)   Collection Time: 08/01/14  1:08 AM  Result Value Ref Range   ABO/RH(D) O POS   hCG, quantitative, pregnancy     Status: Abnormal   Collection Time: 08/01/14  1:08 AM  Result Value Ref Range   hCG, Beta Chain, Quant, S 116 (H) <5 mIU/mL  Wet prep, genital     Status: Abnormal   Collection Time: 08/01/14  1:10 AM  Result Value Ref Range   Yeast Wet Prep HPF POC NONE SEEN NONE SEEN   Trich, Wet Prep NONE SEEN NONE SEEN   Clue Cells Wet Prep HPF POC FEW (A) NONE SEEN   WBC, Wet Prep HPF POC FEW (A) NONE SEEN   US Ob Comp Less 14 Wks  08/01/2014   CLINICAL DATA:  Acute onset of vaginal spotting.  Initial encounter.  EXAM: OBSTETRIC <14 WK [place] AND TRANSVAGINAL OB US  TECHNIQUE: Both transabdominal and transvaginal ultrasound examinations were performed for complete evaluation of the gestation as well as the maternal uterus, adnexal regions, and pelvic cul-de-sac. Transvaginal technique was performed to assess early pregnancy.  COMPARISON:  None.  FINDINGS: Intrauterine gestational sac:  None seen.  Yolk sac:  N/A  Embryo:  N/A  Maternal uterus/adnexae: No subchorionic hemorrhage is noted. The uterus is unremarkable in appearance. The endometrial echo complex measures 1.6 cm in thickness. Two small uterine fibroids are seen, measuring 1.6 cm and 1.1 cm in size.  The ovaries are unremarkable in appearance. The right ovary measures 3.1 x 1.9 x 1.9 cm, while the left ovary measures 2.5 x 2.4 x 1.8 cm. No suspicious adnexal masses are seen;  there is no evidence for ovarian torsion.  No free fluid is seen within the pelvic cul-de-sac.  IMPRESSION: 1. No intrauterine gestational sac seen. No evidence of ectopic pregnancy at this time. This is within normal limits, as the quantitative beta HCG level is 116. Would follow-up quantitative beta HCG level. If it continues to trend  upward, follow-up pelvic ultrasound could be considered in 2 weeks, as deemed clinically appropriate. 2. Small uterine fibroids seen, measuring up to 1.6 cm in size.   Electronically Signed   By: Garald Balding M.D.   On: 08/01/2014 02:46   US Ob Transvaginal  08/01/2014   CLINICAL DATA:  Acute onset of vaginal spotting.  Initial encounter.  EXAM: OBSTETRIC <14 WK [place] AND TRANSVAGINAL OB US  TECHNIQUE: Both transabdominal and transvaginal ultrasound examinations were performed for complete evaluation of the gestation as well as the maternal uterus, adnexal regions, and pelvic cul-de-sac. Transvaginal technique was performed to assess early pregnancy.  COMPARISON:  None.  FINDINGS: Intrauterine gestational sac:  None seen.  Yolk sac:  N/A  Embryo:  N/A  Maternal uterus/adnexae: No subchorionic hemorrhage is noted. The uterus is unremarkable in appearance. The endometrial echo complex measures 1.6 cm in thickness. Two small uterine fibroids are seen, measuring 1.6 cm and 1.1 cm in size.  The ovaries are unremarkable in appearance. The right ovary measures 3.1 x 1.9 x 1.9 cm, while the left ovary measures 2.5 x 2.4 x 1.8 cm. No suspicious adnexal masses are seen; there is no evidence for ovarian torsion.  No free fluid is seen within the pelvic cul-de-sac.  IMPRESSION: 1. No intrauterine gestational sac seen. No evidence of ectopic pregnancy at this time. This is within normal limits, as the quantitative beta HCG level is 116. Would follow-up quantitative beta HCG level. If it continues to trend upward, follow-up pelvic ultrasound could be considered in 2 weeks, as deemed clinically  appropriate. 2. Small uterine fibroids seen, measuring up to 1.6 cm in size.   Electronically Signed   By: Garald Balding M.D.   On: 08/01/2014 02:46    MAU Course  Procedures None  MDM + UPT  UA, wet prep, GC/Chlamydia, CBC, HIV, RPR, quant hCG, ABO/Rh and [place] today Assessment and Plan  A: Abdominal pain in pregnancy  Spotting in pregnancy, first trimester Bacterial vaginosis Uterine fibroid  P: Discharge home Rx for Flagyl given to patient Bleeding/ectopic precautions discussed Patient advised to return to MAU 08/03/14 in the morning for follow-up labs or sooner if symptoms worsen  Luvenia Redden, PA-C  08/01/2014, 2:51 AM

## 2014-08-01 NOTE — MAU Note (Signed)
Positive pregnancy test today at home. Abdominal pain since 1/30.  Vaginal spotting since 07/23/14.

## 2014-08-01 NOTE — Discharge Instructions (Signed)
First Trimester of Pregnancy The first trimester of pregnancy is from week 1 until the end of week 12 (months 1 through 3). During this time, your baby will begin to develop inside you. At 6-8 weeks, the eyes and face are formed, and the heartbeat can be seen on ultrasound. At the end of 12 weeks, all the baby's organs are formed. Prenatal care is all the medical care you receive before the birth of your baby. Make sure you get good prenatal care and follow all of your doctor's instructions. HOME CARE  Medicines  Take medicine only as told by your doctor. Some medicines are safe and some are not during pregnancy.  Take your prenatal vitamins as told by your doctor.  Take medicine that helps you poop (stool softener) as needed if your doctor says it is okay. Diet  Eat regular, healthy meals.  Your doctor will tell you the amount of weight gain that is right for you.  Avoid raw meat and uncooked cheese.  If you feel sick to your stomach (nauseous) or throw up (vomit):  Eat 4 or 5 small meals a day instead of 3 large meals.  Try eating a few soda crackers.  Drink liquids between meals instead of during meals.  If you have a hard time pooping (constipation):  Eat high-fiber foods like fresh vegetables, fruit, and whole grains.  Drink enough fluids to keep your pee (urine) clear or pale yellow. Activity and Exercise  Exercise only as told by your doctor. Stop exercising if you have cramps or pain in your lower belly (abdomen) or low back.  Try to avoid standing for long periods of time. Move your legs often if you must stand in one place for a long time.  Avoid heavy lifting.  Wear low-heeled shoes. Sit and stand up straight.  You can have sex unless your doctor tells you not to. Relief of Pain or Discomfort  Wear a good support bra if your breasts are sore.  Take warm water baths (sitz baths) to soothe pain or discomfort caused by hemorrhoids. Use hemorrhoid cream if your  doctor says it is okay.  Rest with your legs raised if you have leg cramps or low back pain.  Wear support hose if you have puffy, bulging veins (varicose veins) in your legs. Raise (elevate) your feet for 15 minutes, 3-4 times a day. Limit salt in your diet. Prenatal Care  Schedule your prenatal visits by the twelfth week of pregnancy.  Write down your questions. Take them to your prenatal visits.  Keep all your prenatal visits as told by your doctor. Safety  Wear your seat belt at all times when driving.  Make a list of emergency phone numbers. The list should include numbers for family, friends, the hospital, and police and fire departments. General Tips  Ask your doctor for a referral to a local prenatal class. Begin classes no later than at the start of month 6 of your pregnancy.  Ask for help if you need counseling or help with nutrition. Your doctor can give you advice or tell you where to go for help.  Do not use hot tubs, steam rooms, or saunas.  Do not douche or use tampons or scented sanitary pads.  Do not cross your legs for long periods of time.  Avoid litter boxes and soil used by cats.  Avoid all smoking, herbs, and alcohol. Avoid drugs not approved by your doctor.  Visit your dentist. At home, brush your teeth  with a soft toothbrush. Be gentle when you floss. GET HELP IF:  You are dizzy.  You have mild cramps or pressure in your lower belly.  You have a nagging pain in your belly area.  You continue to feel sick to your stomach, throw up, or have watery poop (diarrhea).  You have a bad smelling fluid coming from your vagina.  You have pain with peeing (urination).  You have increased puffiness (swelling) in your face, hands, legs, or ankles. GET HELP RIGHT AWAY IF:   You have a fever.  You are leaking fluid from your vagina.  You have spotting or bleeding from your vagina.  You have very bad belly cramping or pain.  You gain or lose weight  rapidly.  You throw up blood. It may look like coffee grounds.  You are around people who have [place] measles, fifth disease, or chickenpox.  You have a very bad headache.  You have shortness of breath.  You have any kind of trauma, such as from a fall or a car accident. Document Released: 12/01/2007 Document Revised: 10/29/2013 Document Reviewed: 04/24/2013 [facility] Patient Information 2015 [place], [place]. This information is not intended to replace advice given to you by your health care provider. Make sure you discuss any questions you have with your health care provider. Pelvic Rest Pelvic rest is sometimes recommended for women when:   The placenta is partially or completely covering the opening of the cervix (placenta previa).  There is bleeding between the uterine wall and the amniotic sac in the first trimester (subchorionic hemorrhage).  The cervix begins to open without labor starting (incompetent cervix, cervical insufficiency).  The labor is too early (preterm labor). HOME CARE INSTRUCTIONS  Do not have sexual intercourse, stimulation, or an orgasm.  Do not use tampons, douche, or put anything in the vagina.  Do not lift anything over 10 pounds (4.5 kg).  Avoid strenuous activity or straining your pelvic muscles. SEEK MEDICAL CARE IF:  You have any vaginal bleeding during pregnancy. Treat this as a potential emergency.  You have cramping pain felt low in the stomach (stronger than menstrual cramps).  You notice vaginal discharge (watery, mucus, or bloody).  You have a low, dull backache.  There are regular contractions or uterine tightening. SEEK IMMEDIATE MEDICAL CARE IF: You have vaginal bleeding and have placenta previa.  Document Released: 10/09/2010 Document Revised: 09/06/2011 Document Reviewed: 10/09/2010 [facility] Patient Information 2015 [place], [place]. This information is not intended to replace advice given to you by your health care provider.  Make sure you discuss any questions you have with your health care provider.

## 2014-08-02 LAB — RPR: RPR: NONREACTIVE

## 2014-08-03 ENCOUNTER — Inpatient Hospital Stay (HOSPITAL_COMMUNITY)
Admission: AD | Admit: 2014-08-03 | Discharge: 2014-08-03 | Disposition: A | Discharge status: Home / Self Care | Payer: Self-pay | Source: Ambulatory Visit | Attending: Obstetrics & Gynecology | Admitting: Obstetrics & Gynecology

## 2014-08-03 ENCOUNTER — Encounter (HOSPITAL_COMMUNITY): Payer: Self-pay

## 2014-08-03 DIAGNOSIS — Z3A00 Weeks of gestation of pregnancy not specified: Secondary | ICD-10-CM | POA: Insufficient documentation

## 2014-08-03 DIAGNOSIS — O039 Complete or unspecified spontaneous abortion without complication: Secondary | ICD-10-CM | POA: Insufficient documentation

## 2014-08-03 LAB — URINE MICROSCOPIC-ADD ON

## 2014-08-03 LAB — URINALYSIS, ROUTINE W REFLEX MICROSCOPIC
BILIRUBIN URINE: NEGATIVE
Glucose, UA: NEGATIVE mg/dL
Ketones, ur: NEGATIVE mg/dL
Leukocytes, UA: NEGATIVE
NITRITE: NEGATIVE
PROTEIN: NEGATIVE mg/dL
Urobilinogen, UA: 0.2 mg/dL (ref 0.0–1.0)
pH: 6 (ref 5.0–8.0)

## 2014-08-03 LAB — HCG, QUANTITATIVE, PREGNANCY: hCG, Beta Chain, Quant, S: 71 m[IU]/mL — ABNORMAL HIGH (ref <5)

## 2014-08-03 NOTE — MAU Note (Signed)
Pt started spotting on 1/26 and is still spotting.  Pt reports a lot of gas.

## 2014-08-03 NOTE — Discharge Instructions (Signed)
Miscarriage A miscarriage is the loss of an unborn baby (fetus) before the 20th week of pregnancy. The cause is often unknown.  HOME CARE  You may need to stay in bed (bed rest), or you may be able to do light activity. Go about activity as told by your doctor.  Have help at home.  Write down how many pads you use each day. Write down how soaked they are.  Do not use tampons. Do not wash out your vagina (douche) or have sex (intercourse) until your doctor approves.  Only take medicine as told by your doctor.  Do not take aspirin.  Keep all doctor visits as told.  If you or your partner have problems with grieving, talk to your doctor. You can also try counseling. Give yourself time to grieve before trying to get pregnant again. GET HELP RIGHT AWAY IF:  You have bad cramps or pain in your back or belly (abdomen).  You have a fever.  You pass large clumps of blood (clots) from your vagina that are walnut-sized or larger. Save the clumps for your doctor to see.  You pass large amounts of tissue from your vagina. Save the tissue for your doctor to see.  You have more bleeding.  You have thick, bad-smelling fluid (discharge) coming from the vagina.  You get lightheaded, weak, or you pass out (faint).  You have chills. MAKE SURE YOU:  Understand these instructions.  Will watch your condition.  Will get help right away if you are not doing well or get worse. Document Released: 09/06/2011 Document Reviewed: 09/06/2011 [facility] Patient Information 2015 [place]. This information is not intended to replace advice given to you by your health care provider. Make sure you discuss any questions you have with your health care provider.

## 2014-08-03 NOTE — MAU Provider Note (Signed)
Subjective:  [name] is a 24 y.o. female [place] who presents for a follow up beta hcg level. She was originally seen 2 days ago for cramping and bleeding. She currently denies pain, however continues to have very light spotting.    Objective:  GENERAL: Well-developed, well-nourished female in no acute distress. Patient is tearful  HEENT: Normocephalic, atraumatic.   LUNGS: Effort normal ABDOMEN; soft, non tenderness  SKIN: Warm, dry and without erythema PSYCH: Normal mood and affect  Filed Vitals:   08/03/14 1102  BP: 124/60  Pulse: 76  Temp: 98.1 F (36.7 C)  TempSrc: Oral  Resp: 16   Results for orders placed or performed during the hospital encounter of 08/03/14 (from the past 48 hour(s))  Urinalysis, Routine w reflex microscopic     Status: Abnormal   Collection Time: 08/03/14 10:47 AM  Result Value Ref Range   Color, Urine YELLOW YELLOW   APPearance CLEAR CLEAR   Specific Gravity, Urine >1.030 (H) 1.005 - 1.030   pH 6.0 5.0 - 8.0   Glucose, UA NEGATIVE NEGATIVE mg/dL   Hgb urine dipstick SMALL (A) NEGATIVE   Bilirubin Urine NEGATIVE NEGATIVE   Ketones, ur NEGATIVE NEGATIVE mg/dL   Protein, ur NEGATIVE NEGATIVE mg/dL   Urobilinogen, UA 0.2 0.0 - 1.0 mg/dL   Nitrite NEGATIVE NEGATIVE   Leukocytes, UA NEGATIVE NEGATIVE  Urine microscopic-add on     Status: Abnormal   Collection Time: 08/03/14 10:47 AM  Result Value Ref Range   Squamous Epithelial / LPF FEW (A) RARE   WBC, UA 0-2 <3 WBC/hpf   RBC / HPF 0-2 <3 RBC/hpf   Urine-Other MUCOUS PRESENT   hCG, quantitative, pregnancy     Status: Abnormal   Collection Time: 08/03/14 11:46 AM  Result Value Ref Range   hCG, Beta Chain, Quant, S 71 (H) <5 mIU/mL    Comment:          GEST. AGE      CONC.  (mIU/mL)   <=1 WEEK        5 - 50     2 WEEKS       50 - 500     3 WEEKS       100 - 10,000     4 WEEKS     1,000 - 30,000     5 WEEKS     3,500 - 115,000   6-8 WEEKS     12,000 - 270,000    12 WEEKS      15,000 - 220,000        FEMALE AND NON-PREGNANT FEMALE:     LESS THAN 5 mIU/mL      MDM  O positive blood type  Beta hcg level 2/4: 116 Beta hcg level 2/6: 71  Assessment:  SAB   Plan:  Discharge home in stable condition Follow up in the clinic in 1 week for beta hcg Support given Return to MAU as needed, if symptoms worsen    Darrelyn Hillock Ilyse Tremain, NP 08/03/2014 2:59 PM

## 2014-08-12 ENCOUNTER — Other Ambulatory Visit: Payer: Self-pay

## 2014-08-14 ENCOUNTER — Other Ambulatory Visit: Payer: Self-pay

## 2014-08-14 DIAGNOSIS — O209 Hemorrhage in early pregnancy, unspecified: Secondary | ICD-10-CM

## 2014-08-15 LAB — HCG, QUANTITATIVE, PREGNANCY: hCG, Beta Chain, Quant, S: <2 m[IU]/mL

## 2014-12-26 ENCOUNTER — Encounter (HOSPITAL_COMMUNITY): Payer: Self-pay

## 2014-12-26 ENCOUNTER — Emergency Department (HOSPITAL_COMMUNITY)
Admission: EM | Admit: 2014-12-26 | Discharge: 2014-12-26 | Disposition: A | Discharge status: Home / Self Care | Payer: Self-pay | Attending: Emergency Medicine | Admitting: Emergency Medicine

## 2014-12-26 DIAGNOSIS — Z79899 Other long term (current) drug therapy: Secondary | ICD-10-CM | POA: Insufficient documentation

## 2014-12-26 DIAGNOSIS — Z3202 Encounter for pregnancy test, result negative: Secondary | ICD-10-CM | POA: Insufficient documentation

## 2014-12-26 DIAGNOSIS — Z792 Long term (current) use of antibiotics: Secondary | ICD-10-CM | POA: Insufficient documentation

## 2014-12-26 DIAGNOSIS — N898 Other specified noninflammatory disorders of vagina: Secondary | ICD-10-CM | POA: Insufficient documentation

## 2014-12-26 DIAGNOSIS — Z87891 Personal history of nicotine dependence: Secondary | ICD-10-CM | POA: Insufficient documentation

## 2014-12-26 LAB — WET PREP, GENITAL
Trich, Wet Prep: NONE SEEN
WBC, Wet Prep HPF POC: NONE SEEN
Yeast Wet Prep HPF POC: NONE SEEN

## 2014-12-26 LAB — POC URINE PREG, ED: PREG TEST UR: NEGATIVE

## 2014-12-26 MED ORDER — FLUCONAZOLE 100 MG PO TABS
150.0000 mg | ORAL_TABLET | Freq: Every day | ORAL | Status: DC
  Administered 2014-12-26: 150 mg via ORAL
  Filled 2014-12-26: qty 2

## 2014-12-26 NOTE — ED Provider Notes (Signed)
CSN: [id number]     Arrival date & time 12/26/14  1506 History  This chart was scribed for non-physician practitioner Jeannett Senior, PA-C working with Pamella Pert, MD by Zola Button, ED Scribe. This patient was seen in room TR11C/TR11C and the patient's care was started at 3:37 PM.       Chief Complaint  Patient presents with  . Possible Pregnancy   HPI  HPI Comments: Adrienne Walker is a 24 y.o. female who presents to the Emergency Department complaining of vaginal discharge. She is also here requesting a pregnancy test. Patient states she was treated for vaginal discharge in February, but she started having discharge again several weeks ago. She denies dyspareunia and vaginal pain. She denies any abdominal pain. No urinary symptoms. Last course of antibiotics was in February for BV. Her LNMP was 11/03/14.   Past Medical History  Diagnosis Date  . Medical history non-contributory    Past Surgical History  Procedure Laterality Date  . No past surgeries     Family History  Problem Relation Age of Onset  . Diabetes Mother    History  Substance Use Topics  . Smoking status: Former Smoker    Quit date: 07/31/2014  . Smokeless tobacco: Never Used  . Alcohol Use: No     Comment: occasionally   OB History    Gravida Para Term Preterm AB TAB SAB Ectopic Multiple Living   1 0 0 0 0 0 0 0 0 0      Review of Systems  Constitutional: Negative for fever and chills.  Gastrointestinal: Negative for abdominal pain.  Genitourinary: Positive for vaginal discharge. Negative for dysuria, vaginal bleeding, vaginal pain, pelvic pain and dyspareunia.  All other systems reviewed and are negative.     Allergies  Review of patient's allergies indicates no known allergies.  Home Medications   Prior to Admission medications   Medication Sig Start Date End Date Taking? Authorizing Provider  metroNIDAZOLE (FLAGYL) 500 MG tablet Take 1 tablet (500 mg total) by mouth 2 (two) times daily.  08/01/14   Luvenia Redden, PA-C  Prenatal Vit-Fe Fumarate-FA (PRENATAL MULTIVITAMIN) TABS tablet Take 1 tablet by mouth daily at 12 noon.    Historical Provider, MD   BP 129/78 mmHg  Pulse 88  Temp(Src) 98.8 F (37.1 C) (Oral)  Resp 18  SpO2 99%  LMP 06/28/2014 Physical Exam  Constitutional: She is oriented to person, place, and time. She appears well-developed and well-nourished. No distress.  HENT:  Head: Normocephalic.  Eyes: Conjunctivae are normal.  Neck: Neck supple.  Cardiovascular: Normal rate, regular rhythm and normal heart sounds.   Pulmonary/Chest: Effort normal and breath sounds normal. No respiratory distress. She has no wheezes. She has no rales.  Abdominal: Soft. Bowel sounds are normal. She exhibits no distension. There is no tenderness. There is no rebound.  Genitourinary:  Normal external genitalia. Normal vaginal canal. Thick, dry cottage cheese like discharge, white. Cervix is normal, closed. No CMT. No uterine or adnexal tenderness. No masses palpated.    Musculoskeletal: She exhibits no edema.  Neurological: She is alert and oriented to person, place, and time.  Skin: Skin is warm and dry.  Psychiatric: She has a normal mood and affect. Her behavior is normal.  Nursing note and vitals reviewed.   ED Course  Procedures  DIAGNOSTIC STUDIES: Oxygen Saturation is 99% on room air, normal by my interpretation.    COORDINATION OF CARE: 3:39 PM-Discussed treatment plan which includes pregnancy test and pelvic  exam with patient/guardian at bedside and patient/guardian agreed to plan.    Labs Review Labs Reviewed  WET PREP, GENITAL  POC URINE PREG, ED  GC/CHLAMYDIA PROBE AMP ([place]) NOT AT [facility]    Imaging Review No results found.   EKG Interpretation None      MDM   Final diagnoses:  Vaginal discharge   patient emergency department requesting a pelvic exam for vaginal discharge that started several weeks ago. The dyspareunia, urinary symptoms,  no abdominal pain. She also admits to missing a period This month so she is requesting a pregnancy test. Denies any vaginal bleeding.  preg test negative. Wet prep unremarkable. Plan to dc home with outpatient follow up. Diflucan given for cottage cheese discharge, question yeast. Return precautions discussed.   Filed Vitals:   12/26/14 1538  BP: 129/78  Pulse: 88  Temp: 98.8 F (37.1 C)  TempSrc: Oral  Resp: 18  SpO2: 99%     I personally performed the services described in this documentation, which was scribed in my presence. The recorded information has been reviewed and is accurate.   Jeannett Senior, PA-C 12/28/14 1543  Pamella Pert, MD 12/28/14 2248

## 2014-12-26 NOTE — ED Notes (Signed)
Pt requesting a pregnancy test. States she is having some white discharge and pain with motion. Denies any bleeding or smell. States LMP was 11/03/14.

## 2014-12-26 NOTE — Discharge Instructions (Signed)
Your pregnancy test today was negative. You were treated today for possible yeast infection based on your exam, however your swabs did not show any findings. Avoid any scented or hygiene products. Follow up with ob/gyn. Marland Kitchen

## 2014-12-27 LAB — GC/CHLAMYDIA PROBE AMP (~~LOC~~) NOT AT ARMC
CHLAMYDIA, DNA PROBE: NEGATIVE
Neisseria Gonorrhea: NEGATIVE

## 2015-01-31 ENCOUNTER — Inpatient Hospital Stay (HOSPITAL_COMMUNITY): Payer: Medicaid Other

## 2015-01-31 ENCOUNTER — Encounter (HOSPITAL_COMMUNITY): Payer: Self-pay | Admitting: *Deleted

## 2015-01-31 ENCOUNTER — Inpatient Hospital Stay (HOSPITAL_COMMUNITY)
Admission: AD | Admit: 2015-01-31 | Discharge: 2015-01-31 | Disposition: A | Discharge status: Home / Self Care | Payer: Medicaid Other | Source: Ambulatory Visit | Attending: Obstetrics & Gynecology | Admitting: Obstetrics & Gynecology

## 2015-01-31 DIAGNOSIS — Z3A08 8 weeks gestation of pregnancy: Secondary | ICD-10-CM | POA: Diagnosis not present

## 2015-01-31 DIAGNOSIS — O26899 Other specified pregnancy related conditions, unspecified trimester: Secondary | ICD-10-CM

## 2015-01-31 DIAGNOSIS — R109 Unspecified abdominal pain: Secondary | ICD-10-CM | POA: Diagnosis present

## 2015-01-31 DIAGNOSIS — R1031 Right lower quadrant pain: Secondary | ICD-10-CM | POA: Diagnosis not present

## 2015-01-31 DIAGNOSIS — O9989 Other specified diseases and conditions complicating pregnancy, childbirth and the puerperium: Secondary | ICD-10-CM | POA: Insufficient documentation

## 2015-01-31 DIAGNOSIS — Z87891 Personal history of nicotine dependence: Secondary | ICD-10-CM | POA: Diagnosis not present

## 2015-01-31 LAB — URINALYSIS, ROUTINE W REFLEX MICROSCOPIC
Bilirubin Urine: NEGATIVE
Glucose, UA: NEGATIVE mg/dL
Hgb urine dipstick: NEGATIVE
Ketones, ur: NEGATIVE mg/dL
LEUKOCYTES UA: NEGATIVE
Nitrite: NEGATIVE
PH: 6 (ref 5.0–8.0)
Protein, ur: NEGATIVE mg/dL
Specific Gravity, Urine: >1.03 — ABNORMAL HIGH (ref 1.005–1.030)
UROBILINOGEN UA: 0.2 mg/dL (ref 0.0–1.0)

## 2015-01-31 LAB — POCT PREGNANCY, URINE: PREG TEST UR: POSITIVE — AB

## 2015-01-31 LAB — CBC
HCT: 35.2 % — ABNORMAL LOW (ref 36.0–46.0)
HEMOGLOBIN: 12.3 g/dL (ref 12.0–15.0)
MCH: 23.2 pg — ABNORMAL LOW (ref 26.0–34.0)
MCHC: 34.9 g/dL (ref 30.0–36.0)
MCV: 66.3 fL — ABNORMAL LOW (ref 78.0–100.0)
PLATELETS: 212 10*3/uL (ref 150–400)
RBC: 5.31 MIL/uL — AB (ref 3.87–5.11)
RDW: 14.7 % (ref 11.5–15.5)
WBC: 8.2 10*3/uL (ref 4.0–10.5)

## 2015-01-31 LAB — HCG, QUANTITATIVE, PREGNANCY: HCG, BETA CHAIN, QUANT, S: 31990 m[IU]/mL — AB (ref <5)

## 2015-01-31 NOTE — MAU Note (Signed)
Patient states she had a positive home pregnancy test and is having some mild cramping.  Denies vaginal bleeding.  States she has to lift heavy things at work and is concerned and wants to find out how far along she is now.  Patient thinks her LMP was 12/06/14.

## 2015-01-31 NOTE — MAU Provider Note (Signed)
History     CSN: [id number]  Arrival date and time: 01/31/15 1939   First Provider Initiated Contact with Patient 01/31/15 2020      Chief Complaint  Patient presents with  . Abdominal Pain  . Possible Pregnancy   This is a 24 y.o. female at [redacted]w[redacted]d by LMP who presents with c/o Positive pregnancy test at home and lower abdominal cramping x 1 week. States is mild, does not radiate. Dull, cramping. Denies bleeding. Has some intermittent episodes of shortness of breath but most of the time not noticeable. Recently had a SAB in Feb 0321, uncomplicated  Abdominal Pain This is a new problem. The current episode started in the past 7 days. The onset quality is gradual. The problem occurs intermittently. The problem has been unchanged. The pain is located in the LLQ, RLQ and suprapubic region. The pain is mild. The quality of the pain is cramping and dull. The abdominal pain does not radiate. Pertinent negatives include no anorexia, constipation, diarrhea, dysuria, fever, headaches, nausea or vomiting. Nothing aggravates the pain. The pain is relieved by nothing. She has tried nothing for the symptoms.   RN note: Patient states she had a positive home pregnancy test and is having some mild cramping. Denies vaginal bleeding. States she has to lift heavy things at work and is concerned and wants to find out how far along she is now. Patient thinks her LMP was 12/06/14.          OB History    Gravida Para Term Preterm AB TAB SAB Ectopic Multiple Living   2 0 0 0 1 0 1 0 0 0       Past Medical History  Diagnosis Date  . Medical history non-contributory     Past Surgical History  Procedure Laterality Date  . No past surgeries      Family History  Problem Relation Age of Onset  . Diabetes Mother     History  Substance Use Topics  . Smoking status: Former Smoker    Quit date: 07/31/2014  . Smokeless tobacco: Never Used  . Alcohol Use: No     Comment: occasionally     Allergies: No Known Allergies  Prescriptions prior to admission  Medication Sig Dispense Refill Last Dose  . Prenatal Vit-Fe Fumarate-FA (PRENATAL MULTIVITAMIN) TABS tablet Take 1 tablet by mouth daily at 12 noon.   two weeks at Unknown time   Medical, Surgical, Family and Social histories reviewed and are listed above.  Medications and allergies reviewed.   Review of Systems  Constitutional: Negative for fever, chills and malaise/fatigue.  Respiratory: Positive for shortness of breath (occasionally). Negative for cough, sputum production and wheezing.   Cardiovascular: Negative for chest pain.  Gastrointestinal: Positive for abdominal pain. Negative for nausea, vomiting, diarrhea, constipation and anorexia.  Genitourinary: Negative for dysuria.  Neurological: Negative for dizziness and headaches.  Other systems negative  Physical Exam   Blood pressure 121/57, pulse 78, temperature 98.3 F (36.8 C), temperature source Oral, resp. rate 16, height 5\' 11"  (1.803 m), weight 252 lb (114.306 kg), last menstrual period 12/06/2014, unknown if currently breastfeeding.  Physical Exam  Constitutional: She is oriented to person, place, and time. She appears well-developed and well-nourished. No distress.  HENT:  Head: Normocephalic.  Cardiovascular: Normal rate and regular rhythm.   Respiratory: Effort normal and breath sounds normal. No respiratory distress.  GI: Soft. She exhibits no distension. There is tenderness (very mild over suprapubic area). There is no rebound and  no guarding.  Genitourinary: Vagina normal. No vaginal discharge found.  Cervix long and closed Uterus 5-6 wk size Nontender Adnexa nontender bilaterally  Musculoskeletal: Normal range of motion.  Neurological: She is alert and oriented to person, place, and time.  Skin: Skin is warm and dry.  Psychiatric: She has a normal mood and affect.    MAU Course  Procedures  MDM Cultures done in June, all  negative Will get Quant HCG, CBC today Will get [place] to rule out ectopic Discussed may need to repeat Quant in 48 hrs.   Assessment and Plan  Report to oncoming provider  [facility] 01/31/2015, 8:20 PM   Results for orders placed or performed during the hospital encounter of 01/31/15 (from the past 48 hour(s))  Urinalysis, Routine w reflex microscopic (not at [facility])     Status: Abnormal   Collection Time: 01/31/15  8:05 PM  Result Value Ref Range   Color, Urine YELLOW YELLOW   APPearance CLEAR CLEAR   Specific Gravity, Urine >1.030 (H) 1.005 - 1.030   pH 6.0 5.0 - 8.0   Glucose, UA NEGATIVE NEGATIVE mg/dL   Hgb urine dipstick NEGATIVE NEGATIVE   Bilirubin Urine NEGATIVE NEGATIVE   Ketones, ur NEGATIVE NEGATIVE mg/dL   Protein, ur NEGATIVE NEGATIVE mg/dL   Urobilinogen, UA 0.2 0.0 - 1.0 mg/dL   Nitrite NEGATIVE NEGATIVE   Leukocytes, UA NEGATIVE NEGATIVE    Comment: MICROSCOPIC NOT DONE ON URINES WITH NEGATIVE PROTEIN, BLOOD, LEUKOCYTES, NITRITE, OR GLUCOSE <1000 mg/dL.  Pregnancy, urine POC     Status: Abnormal   Collection Time: 01/31/15  8:10 PM  Result Value Ref Range   Preg Test, Ur POSITIVE (A) NEGATIVE    Comment:        THE SENSITIVITY OF THIS METHODOLOGY IS >24 mIU/mL   hCG, quantitative, pregnancy     Status: Abnormal   Collection Time: 01/31/15  8:25 PM  Result Value Ref Range   hCG, Beta Chain, Quant, S 31990 (H) <5 mIU/mL    Comment:          GEST. AGE      CONC.  (mIU/mL)   <=1 WEEK        5 - 50     2 WEEKS       50 - 500     3 WEEKS       100 - 10,000     4 WEEKS     1,000 - 30,000     5 WEEKS     3,500 - 115,000   6-8 WEEKS     12,000 - 270,000    12 WEEKS     15,000 - 220,000        FEMALE AND NON-PREGNANT FEMALE:     LESS THAN 5 mIU/mL   CBC     Status: Abnormal   Collection Time: 01/31/15  8:25 PM  Result Value Ref Range   WBC 8.2 4.0 - 10.5 K/uL   RBC 5.31 (H) 3.87 - 5.11 MIL/uL   Hemoglobin 12.3 12.0 - 15.0 g/dL   HCT 35.2 (L) 36.0 - 46.0  %   MCV 66.3 (L) 78.0 - 100.0 fL   MCH 23.2 (L) 26.0 - 34.0 pg   MCHC 34.9 30.0 - 36.0 g/dL   RDW 14.7 11.5 - 15.5 %   Platelets 212 150 - 400 K/uL    US Ob Comp Less 14 Wks  01/31/2015   CLINICAL DATA:  Acute onset of right lower quadrant abdominal  pain. Initial encounter.  EXAM: OBSTETRIC <14 WK [place] AND TRANSVAGINAL OB US  TECHNIQUE: Both transabdominal and transvaginal ultrasound examinations were performed for complete evaluation of the gestation as well as the maternal uterus, adnexal regions, and pelvic cul-de-sac. Transvaginal technique was performed to assess early pregnancy.  COMPARISON:  Pelvic ultrasound performed 08/01/2014  FINDINGS: Intrauterine gestational sac: Visualized/normal in shape.  Yolk sac:  No  Embryo:  No  Cardiac Activity: N/A  MSD: 1.95 cm   7 w   0  d  Maternal uterus/adnexae: No subchorionic hemorrhage is noted. Three myometrial leiomyomata are seen, measuring up to 3.0 cm in size. Two are noted on the left, and one on the right.  The ovaries are unremarkable in appearance. The right ovary measures 3.1 x 2.1 x 1.5 cm, while the left ovary measures 2.8 x 1.8 x 2.2 cm. No suspicious adnexal masses are seen; there is no evidence for ovarian torsion.  No free fluid is seen within the pelvic cul-de-sac.  IMPRESSION: 1. Single intrauterine gestational sac noted, with a mean sac diameter of 2.0 cm, corresponding to a gestational age of [redacted] weeks 0 days. No yolk sac or embryo is yet seen; this can still remain within normal limits. If the patient's quantitative beta HCG continues to rise, follow-up pelvic ultrasound could be considered in 2 weeks for further evaluation. 2. Uterine fibroids noted, measuring up to 3.0 cm in size.   Electronically Signed   By: Garald Balding M.D.   On: 01/31/2015 21:17   US Ob Transvaginal  01/31/2015   CLINICAL DATA:  Acute onset of right lower quadrant abdominal pain. Initial encounter.  EXAM: OBSTETRIC <14 WK [place] AND TRANSVAGINAL OB US  TECHNIQUE: Both  transabdominal and transvaginal ultrasound examinations were performed for complete evaluation of the gestation as well as the maternal uterus, adnexal regions, and pelvic cul-de-sac. Transvaginal technique was performed to assess early pregnancy.  COMPARISON:  Pelvic ultrasound performed 08/01/2014  FINDINGS: Intrauterine gestational sac: Visualized/normal in shape.  Yolk sac:  No  Embryo:  No  Cardiac Activity: N/A  MSD: 1.95 cm   7 w   0  d  Maternal uterus/adnexae: No subchorionic hemorrhage is noted. Three myometrial leiomyomata are seen, measuring up to 3.0 cm in size. Two are noted on the left, and one on the right.  The ovaries are unremarkable in appearance. The right ovary measures 3.1 x 2.1 x 1.5 cm, while the left ovary measures 2.8 x 1.8 x 2.2 cm. No suspicious adnexal masses are seen; there is no evidence for ovarian torsion.  No free fluid is seen within the pelvic cul-de-sac.  IMPRESSION: 1. Single intrauterine gestational sac noted, with a mean sac diameter of 2.0 cm, corresponding to a gestational age of [redacted] weeks 0 days. No yolk sac or embryo is yet seen; this can still remain within normal limits. If the patient's quantitative beta HCG continues to rise, follow-up pelvic ultrasound could be considered in 2 weeks for further evaluation. 2. Uterine fibroids noted, measuring up to 3.0 cm in size.   Electronically Signed   By: Garald Balding M.D.   On: 01/31/2015 21:17    A: 1. Abdominal pain affecting pregnancy, antepartum     P: D/C home with miscarriage precautions Discussed U/S findings suspicious but not definitive for miscarriage. Outpatient U/S in 1 week to evaluate viability Return to MAU as needed for emergencies   LEFTWICH-KIRBY, Zabella Wease, CNM 10:27 PM

## 2015-01-31 NOTE — Discharge Instructions (Signed)

## 2015-02-07 ENCOUNTER — Ambulatory Visit (HOSPITAL_COMMUNITY): Admission: RE | Admit: 2015-02-07 | Payer: Self-pay | Source: Ambulatory Visit

## 2015-02-10 ENCOUNTER — Encounter (HOSPITAL_COMMUNITY): Payer: Self-pay

## 2015-02-10 ENCOUNTER — Inpatient Hospital Stay (HOSPITAL_COMMUNITY): Payer: Medicaid Other

## 2015-02-10 ENCOUNTER — Inpatient Hospital Stay (HOSPITAL_COMMUNITY)
Admission: AD | Admit: 2015-02-10 | Discharge: 2015-02-10 | Disposition: A | Discharge status: Home / Self Care | Payer: Medicaid Other | Source: Ambulatory Visit | Attending: Family Medicine | Admitting: Family Medicine

## 2015-02-10 DIAGNOSIS — O034 Incomplete spontaneous abortion without complication: Secondary | ICD-10-CM | POA: Insufficient documentation

## 2015-02-10 DIAGNOSIS — O26851 Spotting complicating pregnancy, first trimester: Secondary | ICD-10-CM | POA: Diagnosis present

## 2015-02-10 DIAGNOSIS — O209 Hemorrhage in early pregnancy, unspecified: Secondary | ICD-10-CM | POA: Insufficient documentation

## 2015-02-10 DIAGNOSIS — Z3A08 8 weeks gestation of pregnancy: Secondary | ICD-10-CM | POA: Diagnosis not present

## 2015-02-10 DIAGNOSIS — Z87891 Personal history of nicotine dependence: Secondary | ICD-10-CM | POA: Insufficient documentation

## 2015-02-10 LAB — URINALYSIS, ROUTINE W REFLEX MICROSCOPIC
Bilirubin Urine: NEGATIVE
GLUCOSE, UA: NEGATIVE mg/dL
Hgb urine dipstick: NEGATIVE
KETONES UR: NEGATIVE mg/dL
LEUKOCYTES UA: NEGATIVE
NITRITE: NEGATIVE
PH: 5.5 (ref 5.0–8.0)
Protein, ur: NEGATIVE mg/dL
Specific Gravity, Urine: >1.03 — ABNORMAL HIGH (ref 1.005–1.030)
Urobilinogen, UA: 0.2 mg/dL (ref 0.0–1.0)

## 2015-02-10 LAB — CBC
HEMATOCRIT: 37 % (ref 36.0–46.0)
HEMOGLOBIN: 13.2 g/dL (ref 12.0–15.0)
MCH: 22.6 pg — AB (ref 26.0–34.0)
MCHC: 35.7 g/dL (ref 30.0–36.0)
MCV: 63.2 fL — AB (ref 78.0–100.0)
Platelets: 248 10*3/uL (ref 150–400)
RBC: 5.85 MIL/uL — AB (ref 3.87–5.11)
RDW: 14.9 % (ref 11.5–15.5)
WBC: 7.1 10*3/uL (ref 4.0–10.5)

## 2015-02-10 LAB — HCG, QUANTITATIVE, PREGNANCY: hCG, Beta Chain, Quant, S: 27249 m[IU]/mL — ABNORMAL HIGH (ref <5)

## 2015-02-10 MED ORDER — OXYCODONE-ACETAMINOPHEN 5-325 MG PO TABS: 1.0000 | ORAL_TABLET | ORAL | Status: DC | PRN

## 2015-02-10 MED ORDER — MISOPROSTOL 200 MCG PO TABS: ORAL_TABLET | ORAL | Status: DC

## 2015-02-10 NOTE — Discharge Instructions (Signed)
Incomplete Miscarriage A miscarriage is the sudden loss of an unborn baby (fetus) before the 20th week of pregnancy. In an incomplete miscarriage, parts of the fetus or placenta (afterbirth) remain in the body.  Having a miscarriage can be an emotional experience. Talk with your health care provider about any questions you may have about miscarrying, the grieving process, and your future pregnancy plans. CAUSES   Problems with the fetal chromosomes that make it impossible for the baby to develop normally. Problems with the baby's genes or chromosomes are most often the result of errors that occur by chance as the embryo divides and grows. The problems are not inherited from the parents.  Infection of the cervix or uterus.  Hormone problems.  Problems with the cervix, such as having an incompetent cervix. This is when the tissue in the cervix is not strong enough to hold the pregnancy.  Problems with the uterus, such as an abnormally shaped uterus, uterine fibroids, or congenital abnormalities.  Certain medical conditions.  Smoking, drinking alcohol, or taking illegal drugs.  Trauma. SYMPTOMS   Vaginal bleeding or spotting, with or without cramps or pain.  Pain or cramping in the abdomen or lower back.  Passing fluid, tissue, or blood clots from the vagina. DIAGNOSIS  Your health care provider will perform a physical exam. You may also have an ultrasound to confirm the miscarriage. Blood or urine tests may also be ordered. TREATMENT   You can wait for the pregnancy tissue to pass on its own this is called expectant management.  You can take medicine called Cytotec to cause uterine cramping and help you pass any pregnancy tissue.   A dilation and curettage (D&C) procedure is performed. During a D&C procedure, the cervix is widened (dilated) and any remaining fetal or placental tissue is gently removed from the uterus.  Antibiotic medicines are prescribed if there is an infection.  Other medicines may be given to reduce the size of the uterus (contract) if there is a lot of bleeding.  If you have Rh negative blood and your baby was Rh positive, you will need a Rho (D) immune globulin shot. This shot will protect any future baby from having Rh blood problems in future pregnancies. HOME CARE INSTRUCTIONS   Rest as directed by your health care provider.  Restrict activity as directed by your health care provider. You may be allowed to continue light activity if curettage was not done but you require further treatment.  Keep track of the number of pads you use each day. Keep track of how soaked (saturated) they are. Record this information.  Do not  use tampons.  Do not douche or have sexual intercourse until approved by your health care provider.  Keep all follow-up appointments for reevaluation and continuing management.  Only take over-the-counter or prescription medicines for pain, fever, or discomfort as directed by your health care provider.  Take antibiotic medicine as directed by your health care provider. Make sure you finish it even if you start to feel better. SEEK IMMEDIATE MEDICAL CARE IF:   You experience severe cramps in your stomach, back, or abdomen.  You have an unexplained temperature (make sure to record these temperatures).  You pass large clots or tissue (save these for your health care provider to inspect).  Your bleeding increases.  You become light-headed, weak, or have fainting episodes. MAKE SURE YOU:   Understand these instructions.  Will watch your condition.  Will get help right away if you are not  doing well or get worse. Document Released: 06/14/2005 Document Revised: 10/29/2013 Document Reviewed: 01/11/2013 [facility] Patient Information 2015 [place], [place]. This information is not intended to replace advice given to you by your health care provider. Make sure you discuss any questions you have with your health care  provider.    FACTS YOU SHOULD KNOW  WHAT IS AN EARLY PREGNANCY FAILURE? Once the egg is fertilized with the sperm and begins to develop, it attaches to the lining of the uterus. This early pregnancy tissue may not develop into an embryo (the beginning stage of a baby). Sometimes an embryo does develop but does not continue to grow. These problems can be seen on ultrasound.   MANAGEMNT OF EARLY PREGNANCY FAILURE: About 4 out of 100 (0.25%) women will have a pregnancy loss in her lifetime.  One in five pregnancies is found to be an early pregnancy failure.  There are 3 ways to care for an early pregnancy failure:   (1) Surgery, (2) Medicine, (3) Waiting for you to pass the pregnancy on your own. The decision as to how to proceed after being diagnosed with and early pregnancy failure is an individual one.  The decision can be made only after appropriate counseling.  You need to weigh the pros and cons of the 3 choices. Then you can make the choice that works for you. SURGERY (D&E)  Procedure over in 1 day  Requires being put to sleep  Bleeding may be light  Possible problems during surgery, including injury to womb(uterus)  Care provider has more control Medicine (CYTOTEC)  The complete procedure may take days to weeks  No Surgery  Bleeding may be heavy at times  There may be drug side effects  Patient has more control Waiting  You may choose to wait, in which case your own body may complete the passing of the abnormal early pregnancy on its own in about 2-4 weeks  Your bleeding may be heavy at times  There is a small possibility that you may need surgery if the bleeding is too much or not all of the pregnancy has passed. CYTOTEC MANAGEMENT Prostaglandins (cytotec) are the most widely used drug for this purpose. They cause the uterus to cramp and contract. You will place the medicine yourself inside your vagina in the privacy of your home. Empting of the uterus should occur  within 3 days but the process may continue for several weeks. The bleeding may seem heavy at times. POSSIBLE SIDE EFFECTS FROM CYTOTEC  Nausea   Vomiting  Diarrhea Fever  Chills  Hot Flashes Side effects  from the process of the early pregnancy failure include:  Cramping  Bleeding  Headaches  Dizziness RISKS: This is a low risk procedure. Less than 1 in 100 women has a complication. An incomplete passage of the early pregnancy may occur. Also, Hemorrhage (heavy bleeding) could happen.  Rarely the pregnancy will not be passed completely. Excessively heavy bleeding may occur.  Your doctor may need to perform surgery to empty the uterus (D&E). Afterwards: Everybody will feel differently after the early pregnancy completion. You may have soreness or cramps for a day or two. You may have soreness or cramps for day or two.  You may have light bleeding for up to 2 weeks. You may be as active as you feel like being. If you have any of the following problems you may call Maternity Admissions Unit at [phone].  If you have pain that does not get better  with  pain medication  Bleeding that soaks through 2 thick full-sized sanitary pads in an hour  Cramps that last longer than 2 days  Foul smelling discharge  Fever above 100.4 degrees F Even if you do not have any of these symptoms, you should have a follow-up exam to make sure you are healing properly. This appointment will be made for you before you leave the hospital. Your next normal period will start again in 4-6 week after the loss. You can get pregnant soon after the loss, so use birth control right away. Finally: Make sure all your questions are answered before during and after any procedure. Follow up with medical care and family planning methods.

## 2015-02-10 NOTE — MAU Note (Signed)
Patient presents at [redacted] weeks gestation. Patient reports with vaginal bleeding beginning 08/15, scant amount. Denies vaginal discharge. Denies pain and cramping.

## 2015-02-10 NOTE — MAU Provider Note (Signed)
Chief Complaint: Vaginal Bleeding  First Provider Initiated Contact with Patient 02/10/15 2041      SUBJECTIVE HPI: Adrienne Walker is a 24 y.o. G2P0010 at [redacted]w[redacted]d by LMP who presents to Maternity Admissions reporting spotting today.. Denies fever, chills, passage of tissue, passage of clots, vaginal discharge or abdominal pain.   Location: Vaginal spotting Quality: Scant, brown Severity: 0/10 on pain scale Duration: Less than 24 hours Context: Spontaneous. No recent intercourse. Timing: Today Modifying factors: None Associated Signs and symptoms: None  Patient was seen in maternity admissions 01/31/2015 for abdominal pain in pregnancy. Ultrasound showed a 2 cm gestational sac (consistent with 7 weeks) with no yolk sac or fetal pole. Quant 31,990. O Pos.   Past Medical History  Diagnosis Date  . Medical history non-contributory    OB History  Gravida Para Term Preterm AB SAB TAB Ectopic Multiple Living  2 0 0 0 1 1 0 0 0 0     # Outcome Date GA Lbr Len/2nd Weight Sex Delivery Anes PTL Lv  2 Current           1 SAB 08/03/14 [redacted]w[redacted]d    SAB        Past Surgical History  Procedure Laterality Date  . Mouth surgery      Wisdom teeth removed   Social History   Social History  . Marital Status: Single    Spouse Name: N/A  . Number of Children: N/A  . Years of Education: N/A   Occupational History  . Not on file.   Social History Main Topics  . Smoking status: Former Smoker    Quit date: 07/31/2014  . Smokeless tobacco: Never Used  . Alcohol Use: No     Comment: occasionally  . Drug Use: No  . Sexual Activity: Yes    Birth Control/ Protection: None   Other Topics Concern  . Not on file   Social History Narrative   No current facility-administered medications on file prior to encounter.   Current Outpatient Prescriptions on File Prior to Encounter  Medication Sig Dispense Refill  . Prenatal Vit-Fe Fumarate-FA (PRENATAL MULTIVITAMIN) TABS tablet Take 1 tablet by mouth  daily at 12 noon.     No Known Allergies  I have reviewed the past Medical Hx, Surgical Hx, Social Hx, Allergies and Medications.   Review of Systems  Constitutional: Negative for fever and chills.  Gastrointestinal: Negative for abdominal pain.  Genitourinary: Positive for vaginal bleeding (brown spotting). Negative for dysuria, urgency, hematuria, flank pain, vaginal discharge and pelvic pain.  Musculoskeletal: Negative for back pain.   OBJECTIVE Patient Vitals for the past 24 hrs:  BP Temp Temp src Pulse Resp Height Weight  02/10/15 2014 138/73 mmHg 98.4 F (36.9 C) Oral 87 18 5\' 11"  (1.803 m) 250 lb (113.399 kg)   Constitutional: Well-developed, well-nourished female in no acute distress.  Cardiovascular: normal rate Respiratory: normal rate and effort.  GI: Abd soft, non-tender. Fundus non-palpable. MS: Extremities nontender, no edema, normal ROM Neurologic: Alert and oriented x 4.  GU: SPECULUM EXAM: Declined due to recent exam.  LAB RESULTS Results for orders placed or performed during the hospital encounter of 02/10/15 (from the past 24 hour(s))  Urinalysis, Routine w reflex microscopic (not at [facility])     Status: Abnormal   Collection Time: 02/10/15  8:05 PM  Result Value Ref Range   Color, Urine YELLOW YELLOW   APPearance CLEAR CLEAR   Specific Gravity, Urine >1.030 (H) 1.005 - 1.030   pH  5.5 5.0 - 8.0   Glucose, UA NEGATIVE NEGATIVE mg/dL   Hgb urine dipstick NEGATIVE NEGATIVE   Bilirubin Urine NEGATIVE NEGATIVE   Ketones, ur NEGATIVE NEGATIVE mg/dL   Protein, ur NEGATIVE NEGATIVE mg/dL   Urobilinogen, UA 0.2 0.0 - 1.0 mg/dL   Nitrite NEGATIVE NEGATIVE   Leukocytes, UA NEGATIVE NEGATIVE  hCG, quantitative, pregnancy     Status: Abnormal   Collection Time: 02/10/15  8:45 PM  Result Value Ref Range   hCG, Beta Chain, Quant, S 27249 (H) <5 mIU/mL  CBC     Status: Abnormal   Collection Time: 02/10/15  8:45 PM  Result Value Ref Range   WBC 7.1 4.0 - 10.5 K/uL    RBC 5.85 (H) 3.87 - 5.11 MIL/uL   Hemoglobin 13.2 12.0 - 15.0 g/dL   HCT 37.0 36.0 - 46.0 %   MCV 63.2 (L) 78.0 - 100.0 fL   MCH 22.6 (L) 26.0 - 34.0 pg   MCHC 35.7 30.0 - 36.0 g/dL   RDW 14.9 11.5 - 15.5 %   Platelets 248 150 - 400 K/uL    IMAGING US Ob Comp Less 14 Wks  01/31/2015   CLINICAL DATA:  Acute onset of right lower quadrant abdominal pain. Initial encounter.  EXAM: OBSTETRIC <14 WK [place] AND TRANSVAGINAL OB US  TECHNIQUE: Both transabdominal and transvaginal ultrasound examinations were performed for complete evaluation of the gestation as well as the maternal uterus, adnexal regions, and pelvic cul-de-sac. Transvaginal technique was performed to assess early pregnancy.  COMPARISON:  Pelvic ultrasound performed 08/01/2014  FINDINGS: Intrauterine gestational sac: Visualized/normal in shape.  Yolk sac:  No  Embryo:  No  Cardiac Activity: N/A  MSD: 1.95 cm   7 w   0  d  Maternal uterus/adnexae: No subchorionic hemorrhage is noted. Three myometrial leiomyomata are seen, measuring up to 3.0 cm in size. Two are noted on the left, and one on the right.  The ovaries are unremarkable in appearance. The right ovary measures 3.1 x 2.1 x 1.5 cm, while the left ovary measures 2.8 x 1.8 x 2.2 cm. No suspicious adnexal masses are seen; there is no evidence for ovarian torsion.  No free fluid is seen within the pelvic cul-de-sac.  IMPRESSION: 1. Single intrauterine gestational sac noted, with a mean sac diameter of 2.0 cm, corresponding to a gestational age of [redacted] weeks 0 days. No yolk sac or embryo is yet seen; this can still remain within normal limits. If the patient's quantitative beta HCG continues to rise, follow-up pelvic ultrasound could be considered in 2 weeks for further evaluation. 2. Uterine fibroids noted, measuring up to 3.0 cm in size.   Electronically Signed   By: Garald Balding M.D.   On: 01/31/2015 21:17   US Ob Transvaginal  02/10/2015   CLINICAL DATA:  Vaginal bleeding. Pregnant patient.  Patient is 9 weeks and 3 days pregnant based on her last menstrual period.  EXAM: TRANSVAGINAL OB ULTRASOUND  TECHNIQUE: Transvaginal ultrasound was performed for complete evaluation of the gestation as well as the maternal uterus, adnexal regions, and pelvic cul-de-sac.  COMPARISON:  01/31/2015.  FINDINGS: Intrauterine gestational sac: Visualized/normal in shape.  Yolk sac:  No  Embryo:  Now all  Cardiac Activity: No  Heart Rate:  bpm  MSD: 24.3  mm   7 w   4  d  Maternal uterus/adnexae: Multiple uterine fibroids again noted, largest measuring 3 cm. No endometrial fluid. No other uterine abnormalities. Normal ovaries. No adnexal  masses or free fluid.  IMPRESSION: 1. As on the prior study, there is a gestational sac, but no yolk sac or embryo. At this stage, 10 days following a study that showed a gestational sac without yolk sac or embryo, an with only minimal increase in size of the gestational sac, this is suspicious for non viability, but not definitive. Findings are suspicious but not yet definitive for failed pregnancy. Recommend follow-up US in 10-14 days for definitive diagnosis. This recommendation follows SRU consensus guidelines: Diagnostic Criteria for Nonviable Pregnancy Early in the First Trimester. [employer] Med 2013; 314:9702-63. 2. Stable uterine fibroids.  No ovarian or adnexal abnormality.   Electronically Signed   By: Lajean Manes M.D.   On: 02/10/2015 21:16   MAU COURSE CBC, Quant, ultrasound.       Early Intrauterine Pregnancy Failure  _x__  Documented intrauterine pregnancy failure less than or equal to [redacted] weeks gestation  _x__  No serious current illness  _x__  Baseline Hgb greater than or equal to 10g/dl  _x__  Patient has easily accessible transportation to the hospital  _x__  Clear preference  _x__  Practitioner/physician deems patient reliable  _x__  Counseling by practitioner or physician  _x__  Patient education by RN  _NA_  Rho-Gam given by RN if indicated  _x__  Medication dispensed   _x__   Cytotec 800 mcg  _x_   Intravaginally by patient at home         __   Intravaginally by RN in MAU        __   Rectally by patient at home        __   Rectally by RN in MAU  ___  Ibuprofen 600 mg 1 tablet by mouth every 6 hours as needed #30  _x__  Oxycodone/acetaminophen 5/325 mg by mouth every 4 to 6 hours as needed  ___  Phenergan 12.5 mg by mouth every 4 hours as needed for nausea   MDM Patient is a 24 year old female with 2 new-onset spotting whose ultrasound shows minimal progression since ultrasound 10 days ago and has dropping Quants. DX missed AB. Patient stable and is a candidate for expectant management versus Cytotec versus D&C. Patient unsure how she would like to proceed. Will manage expectantly for now, but would like Cytotec prescription.  ASSESSMENT 1. Incomplete miscarriage   2. Vaginal bleeding in pregnancy, first trimester    PLAN Discharge home in stable condition. Bleeding Precautions Support given. Offered chaplain, declined. Patient requesting testing for recurrent SAB (this is the second one). Explained that this is not a workup that is done in the ED. List of providers given.   Follow-up Information    Follow up with [facility] In 2 weeks.   Specialty:  Obstetrics and Gynecology   Why:  Follow-up miscarriage appointment.   Contact information:   [place] [phone]      Follow up with [facility].   Why:  As needed in emergencies   Contact information:   [address] [id number] [place] [phone]        Medication List    TAKE these medications        misoprostol 200 MCG tablet  Commonly known as:  CYTOTEC  Insert four tablets vaginally. If you do not past tissue within 24 hours, take second dose. If you do not pass tissue within 24 hours of  second dose, call the  office.     oxyCODONE-acetaminophen 5-325 MG per tablet  Commonly known as:  PERCOCET/ROXICET  Take 1 tablet by mouth every 4 (four) hours as needed.     prenatal multivitamin Tabs tablet  Take 1 tablet by mouth daily at 12 noon.       [place], [place] 02/10/2015  8:35 PM

## 2015-02-17 ENCOUNTER — Ambulatory Visit (HOSPITAL_COMMUNITY): Admission: RE | Admit: 2015-02-17 | Payer: Self-pay | Source: Ambulatory Visit

## 2015-03-05 ENCOUNTER — Encounter: Payer: Self-pay | Admitting: Obstetrics & Gynecology

## 2015-12-06 ENCOUNTER — Encounter (HOSPITAL_COMMUNITY): Payer: Self-pay | Admitting: *Deleted

## 2016-07-02 IMAGING — US US OB TRANSVAGINAL
1 series · 13 of 28 positions shown · non-contrast
Comparison: Pelvic ultrasound performed 08/01/2014

CLINICAL DATA: Acute onset of right lower quadrant abdominal pain.
Initial encounter.

EXAM:
OBSTETRIC <14 WK US AND TRANSVAGINAL OB US
TECHNIQUE: Both transabdominal and transvaginal ultrasound examinations were
performed for complete evaluation of the gestation as well as the
maternal uterus, adnexal regions, and pelvic cul-de-sac.
Transvaginal technique was performed to assess early pregnancy.

[Series 1: us ob comp less 14 wk · 45 acquisitions, 13 frames shown]
[im 2/45]
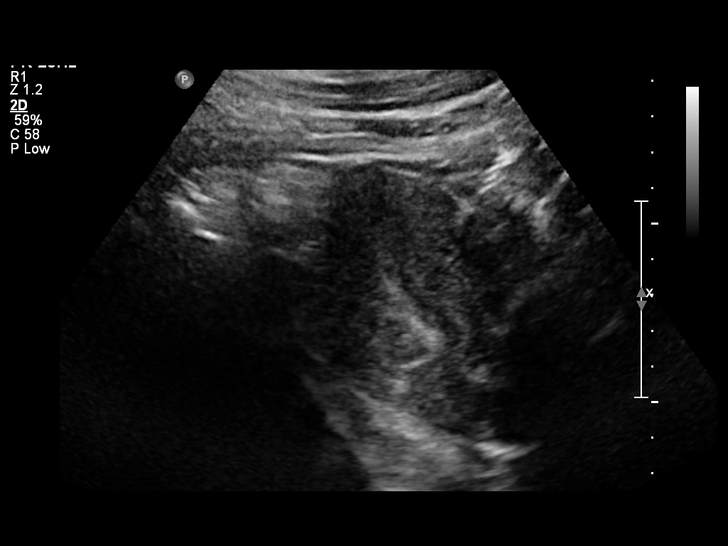
[im 5/45]
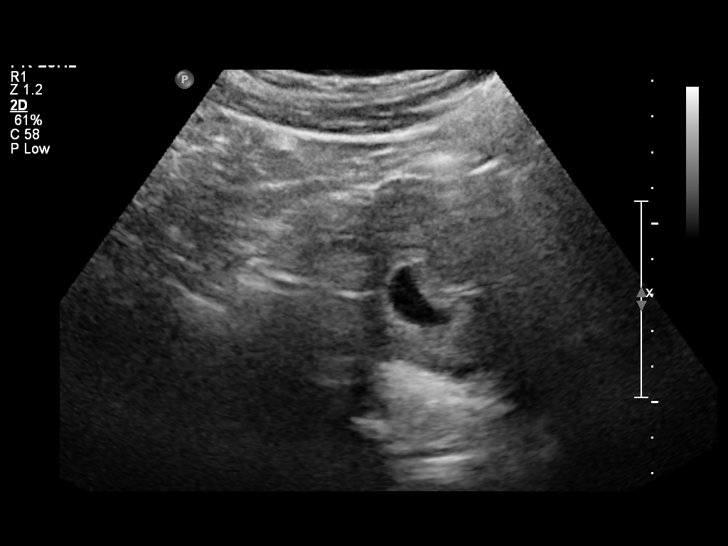
[im 9/45]
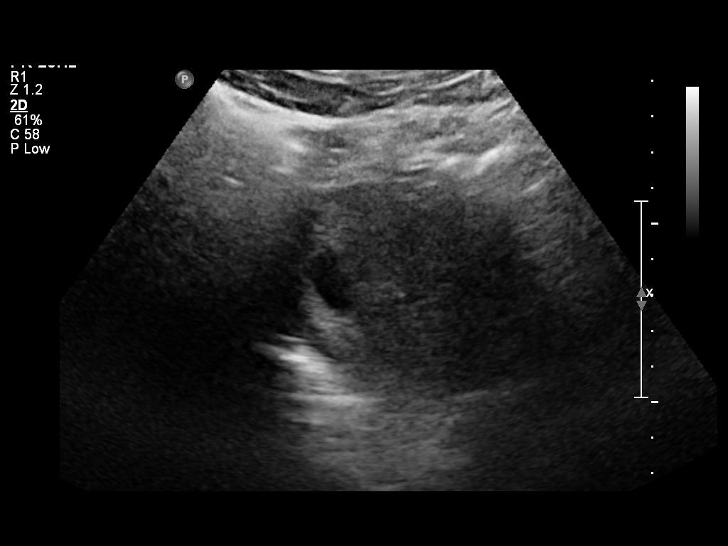
[im 12/45]
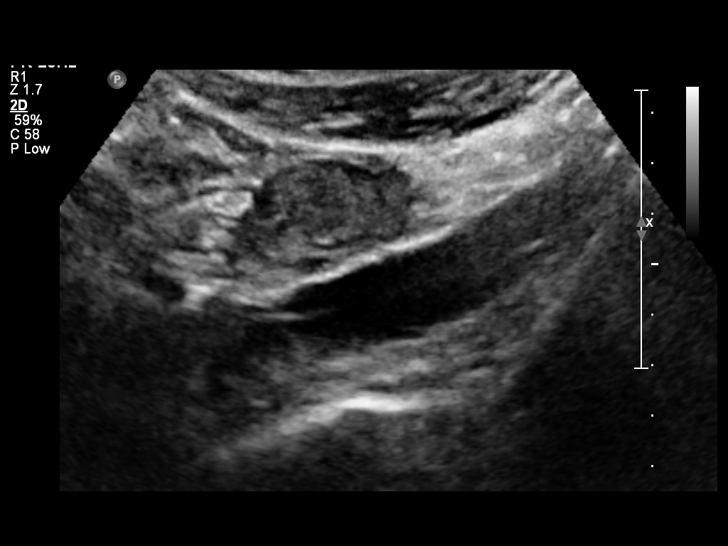
[im 15/45]
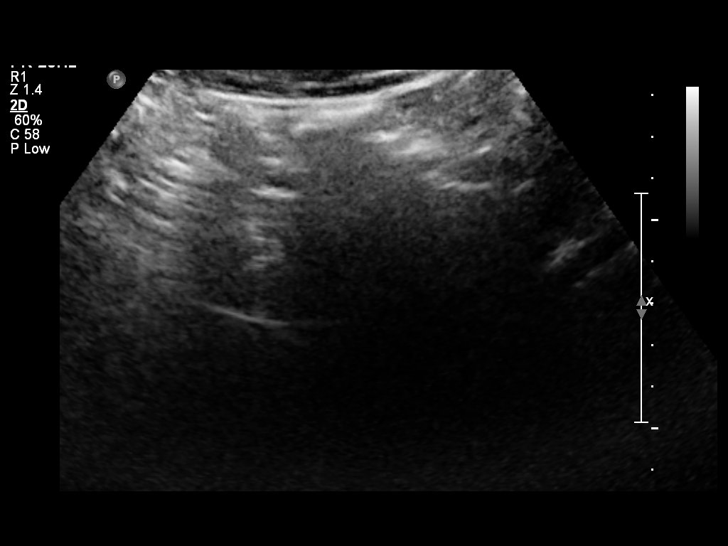
[im 18/45]
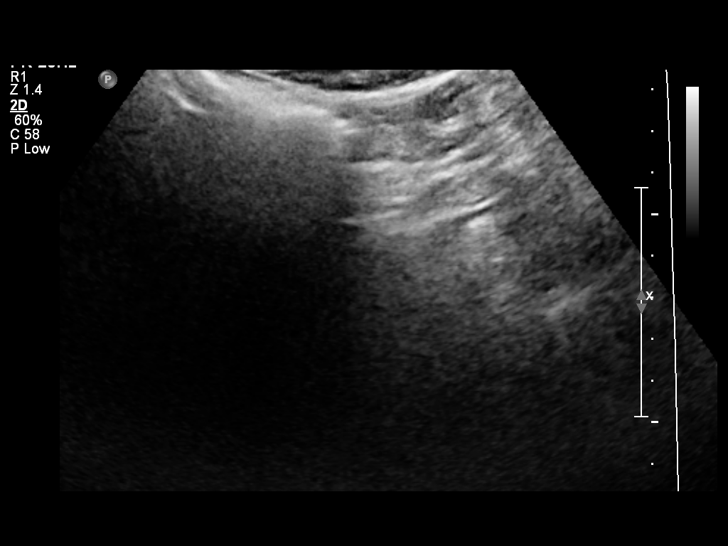
[im 23/45]
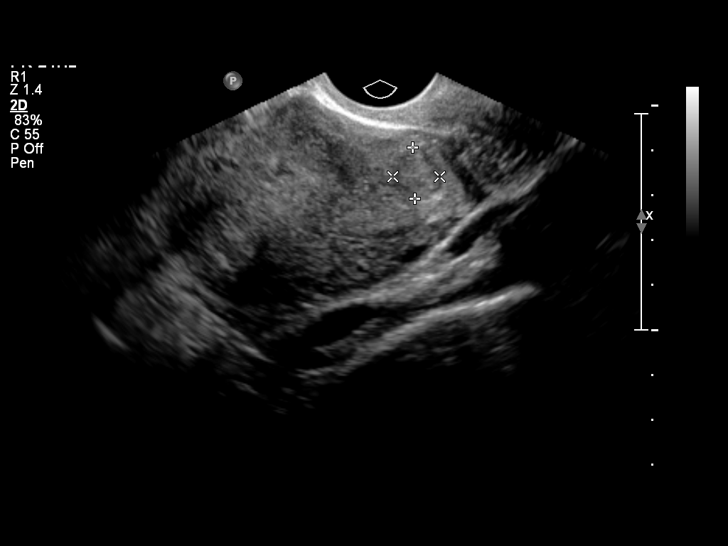
[im 27/45]
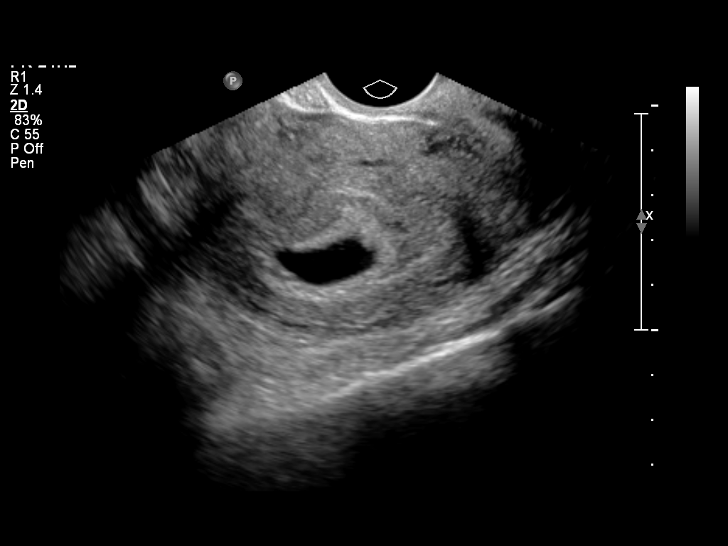
[im 30/45]
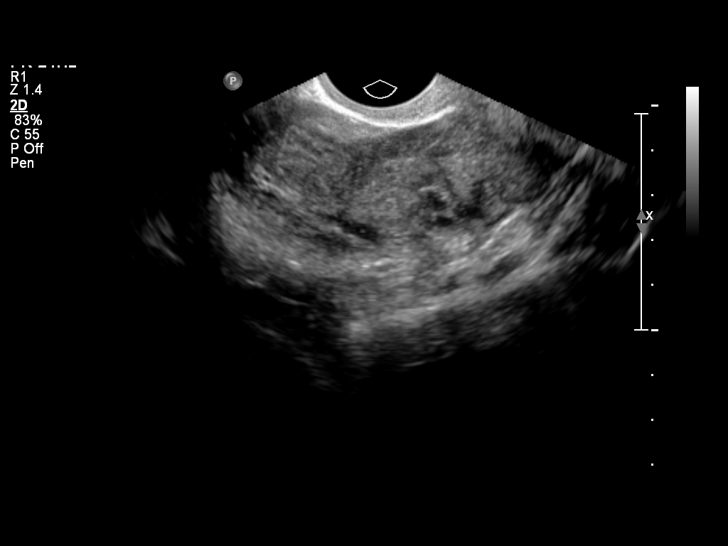
[im 33/45]
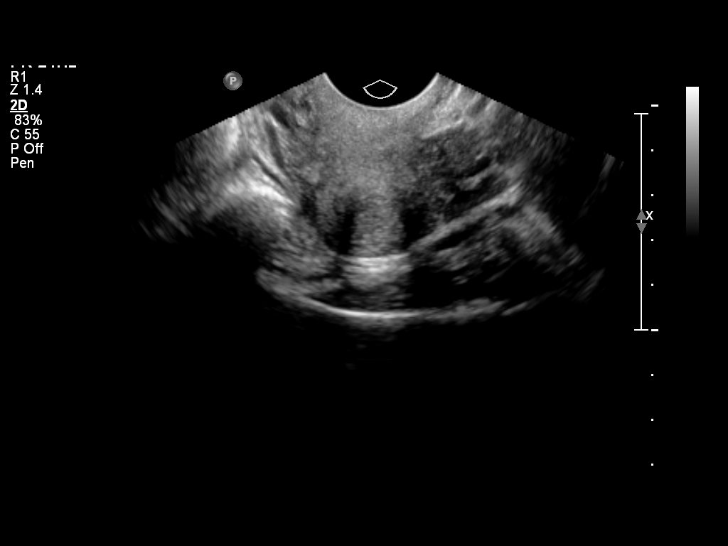
[im 36/45]
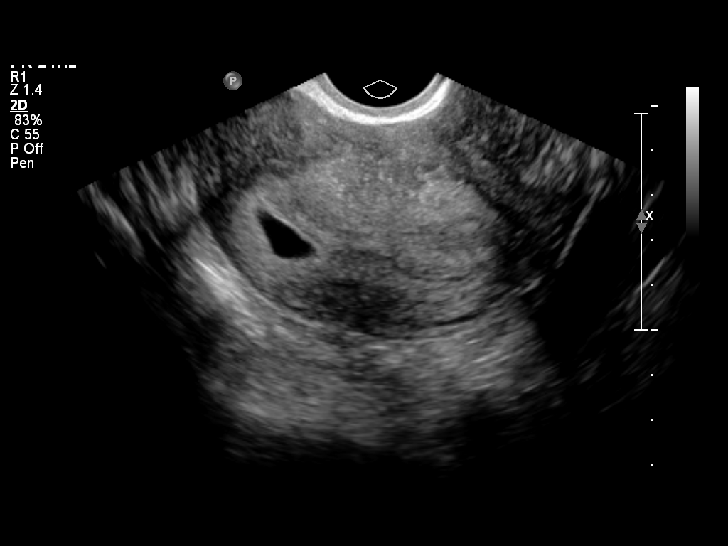
[im 40/45]
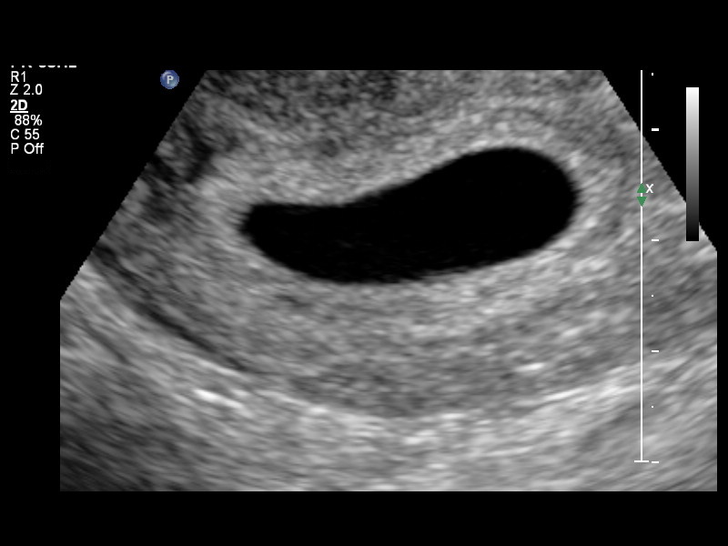
[im 43/45]
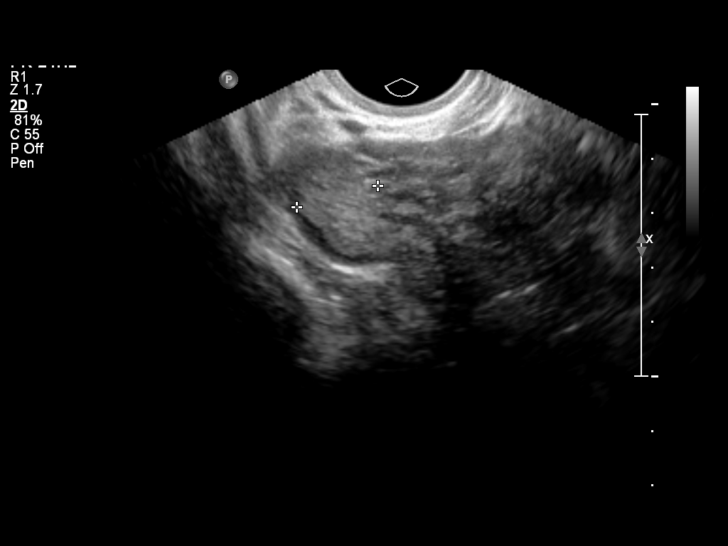

[13 of 28 positions shown; findings below may reference images not displayed]

FINDINGS: Intrauterine gestational sac: Visualized/normal in shape.

Yolk sac:  No

Embryo:  No

Cardiac Activity: N/A

MSD: 1.95 cm   7 w   0  d

Maternal uterus/adnexae: No subchorionic hemorrhage is noted. Three
myometrial leiomyomata are seen, measuring up to 3.0 cm in size. Two
are noted on the left, and one on the right.

The ovaries are unremarkable in appearance. The right ovary measures
3.1 x 2.1 x 1.5 cm, while the left ovary measures 2.8 x 1.8 x
cm. No suspicious adnexal masses are seen; there is no evidence for
ovarian torsion.

No free fluid is seen within the pelvic cul-de-sac.
IMPRESSION: 1. Single intrauterine gestational sac noted, with a mean sac
diameter of 2.0 cm, corresponding to a gestational age of 7 weeks 0
days. No yolk sac or embryo is yet seen; this can still remain
within normal limits. If the patient's quantitative beta HCG
continues to rise, follow-up pelvic ultrasound could be considered
in 2 weeks for further evaluation.
2. Uterine fibroids noted, measuring up to 3.0 cm in size.

## 2016-10-21 ENCOUNTER — Emergency Department (HOSPITAL_COMMUNITY)
Admission: EM | Admit: 2016-10-21 | Discharge: 2016-10-21 | Disposition: A | Discharge status: Home / Self Care | Payer: Medicaid Other | Attending: Emergency Medicine | Admitting: Emergency Medicine

## 2016-10-21 ENCOUNTER — Emergency Department (HOSPITAL_COMMUNITY): Payer: Medicaid Other

## 2016-10-21 ENCOUNTER — Encounter (HOSPITAL_COMMUNITY): Payer: Self-pay | Admitting: Emergency Medicine

## 2016-10-21 DIAGNOSIS — X58XXXA Exposure to other specified factors, initial encounter: Secondary | ICD-10-CM | POA: Insufficient documentation

## 2016-10-21 DIAGNOSIS — M7651 Patellar tendinitis, right knee: Secondary | ICD-10-CM | POA: Insufficient documentation

## 2016-10-21 DIAGNOSIS — Y999 Unspecified external cause status: Secondary | ICD-10-CM | POA: Insufficient documentation

## 2016-10-21 DIAGNOSIS — Z87891 Personal history of nicotine dependence: Secondary | ICD-10-CM | POA: Insufficient documentation

## 2016-10-21 DIAGNOSIS — Y929 Unspecified place or not applicable: Secondary | ICD-10-CM | POA: Insufficient documentation

## 2016-10-21 DIAGNOSIS — Y93A1 Activity, exercise machines primarily for cardiorespiratory conditioning: Secondary | ICD-10-CM | POA: Insufficient documentation

## 2016-10-21 MED ORDER — MELOXICAM 15 MG PO TABS: 15.0000 mg | ORAL_TABLET | Freq: Every day | ORAL | 0 refills | Status: DC

## 2016-10-21 NOTE — Discharge Instructions (Signed)
Ice and elevate your Knee. Avoid any strenuous activity. ACE wrap for compression and support. Start knee exercises as listed in 2-3 days. Follow up with family doctor as needed.

## 2016-10-21 NOTE — ED Notes (Signed)
Patient Alert and oriented X4. Stable and ambulatory. Patient verbalized understanding of the discharge instructions.  Patient belongings were taken by the patient.  

## 2016-10-21 NOTE — ED Notes (Signed)
Patient transported to X-ray 

## 2016-10-21 NOTE — ED Provider Notes (Signed)
[facility] DEPT Provider Note   CSN: [id number] Arrival date & time: 10/21/16  1728   By signing my name below, I, Eunice Blase, attest that this documentation has been prepared under the direction and in the presence of Amrie Gurganus, PA-C. Electronically Signed: Eunice Blase, Scribe. 10/21/16. 6:24 PM.   History   Chief Complaint Chief Complaint  Patient presents with  . Knee Pain   The history is provided by the patient and medical records. No language interpreter was used.    Adrienne Walker is a 26 y.o. female who presents to the Emergency Department with concern for acute onset R knee pain yesterday s/p walking on the treadmill. Pt describes 8/10, constant, sore pain worsened with extending the RLE. No treatments tried. Pain with knee extension. No pain with flexion.  She states she is on her feet for long hours at work. No joint swelling, gait change, injury, fall or trauma noted. No PCP or orthopedic specialist with whom to F/U noted.   Past Medical History:  Diagnosis Date  . Medical history non-contributory     Patient Active Problem List   Diagnosis Date Noted  . VAGINITIS, CANDIDAL 04/02/2010  . ANEMIA 02/03/2010  . FATIGUE 12/31/2009    Past Surgical History:  Procedure Laterality Date  . MOUTH SURGERY     Wisdom teeth removed    OB History    Gravida Para Term Preterm AB Living   2 0 0 0 1 0   SAB TAB Ectopic Multiple Live Births   1 0 0 0         Home Medications    Prior to Admission medications   Medication Sig Start Date End Date Taking? Authorizing Provider  misoprostol (CYTOTEC) 200 MCG tablet Insert four tablets vaginally. If you do not past tissue within 24 hours, take second dose. If you do not pass tissue within 24 hours of second dose, call the office. 02/10/15   Manya Silvas, CNM  oxyCODONE-acetaminophen (PERCOCET/ROXICET) 5-325 MG per tablet Take 1 tablet by mouth every 4 (four) hours as needed. 02/10/15   Manya Silvas, CNM   Prenatal Vit-Fe Fumarate-FA (PRENATAL MULTIVITAMIN) TABS tablet Take 1 tablet by mouth daily at 12 noon.    Historical Provider, MD    Family History Family History  Problem Relation Age of Onset  . Diabetes Mother   . Cancer Maternal Aunt   . Cancer Paternal Aunt     Social History Social History  Substance Use Topics  . Smoking status: Former Smoker    Quit date: 07/31/2014  . Smokeless tobacco: Never Used  . Alcohol use No     Comment: occasionally     Allergies   Patient has no known allergies.   Review of Systems Review of Systems  Constitutional: Negative for chills and fever.  Musculoskeletal: Positive for arthralgias. Negative for gait problem and joint swelling.  Skin: Negative for wound.  Neurological: Negative for weakness and numbness.  All other systems reviewed and are negative.    Physical Exam Updated Vital Signs BP (!) 128/49   Pulse (!) 102   Temp 99.1 F (37.3 C) (Oral)   Resp 18   SpO2 98%   Physical Exam  Constitutional: She is oriented to person, place, and time. She appears well-developed and well-nourished.  HENT:  Head: Normocephalic and atraumatic.  Eyes: Conjunctivae are normal. Pupils are equal, round, and reactive to light. Right eye exhibits no discharge. Left eye exhibits no discharge. No scleral icterus.  Neck:  Normal range of motion. No JVD present. No tracheal deviation present.  Pulmonary/Chest: Effort normal. No stridor.  Musculoskeletal:  Normal-appearing right knee. No swelling. Degenerative outpatient over patella tendon, just distal to the patella. No tenderness over quadricep tendon. Quadricep muscle nontender. Pain with knee extension against resistance. Full range of motion of the knee and no pain with extension passively. Joint is stable. Distal radial pulses intact and equal bilaterally.  Neurological: She is alert and oriented to person, place, and time. Coordination normal.  Psychiatric: She has a normal mood and  affect. Her behavior is normal. Judgment and thought content normal.  Nursing note and vitals reviewed.    ED Treatments / Results  DIAGNOSTIC STUDIES: Oxygen Saturation is 98% on RA, NL by my interpretation.    COORDINATION OF CARE: 6:18 PM-Discussed next steps with pt. Pt verbalized understanding and is agreeable with the plan. Will order imaging. Pt advised of symptomatic care at home and return precautions.   Labs (all labs ordered are listed, but only abnormal results are displayed) Labs Reviewed - No data to display  EKG  EKG Interpretation None       Radiology Dg Knee Complete 4 Views Right  Result Date: 10/21/2016 CLINICAL DATA:  Right knee pain after working out yesterday. EXAM: RIGHT KNEE - COMPLETE 4+ VIEW COMPARISON:  None. FINDINGS: No evidence of fracture, dislocation, or joint effusion. No evidence of arthropathy or other focal bone abnormality. Soft tissues are unremarkable. IMPRESSION: Negative. Electronically Signed   By: Misty Stanley M.D.   On: 10/21/2016 18:53    Procedures Procedures (including critical care time)  Medications Ordered in ED Medications - No data to display   Initial Impression / Assessment and Plan / ED Course  I have reviewed the triage vital signs and the nursing notes.  Pertinent labs & imaging results that were available during my care of the patient were reviewed by me and considered in my medical decision making (see chart for details).     Patient in emergency department with right knee pain after walking or treadmill. Patient is morbidly obese. She does not exercise regularly. She has not had any trauma to the knee other than walking. Her exam is most consistent with patella tendinitis. X-rays negative. Will treat with rice therapy. Ace wrap privded. Home with mobic, follow up with pcp.   Vitals:   10/21/16 1735  BP: (!) 128/49  Pulse: (!) 102  Resp: 18  Temp: 99.1 F (37.3 C)  TempSrc: Oral  SpO2: 98%     Final  Clinical Impressions(s) / ED Diagnoses   Final diagnoses:  Patellar tendinitis of right knee    New Prescriptions New Prescriptions   MELOXICAM (MOBIC) 15 MG TABLET    Take 1 tablet (15 mg total) by mouth daily.   I personally performed the services described in this documentation, which was scribed in my presence. The recorded information has been reviewed and is accurate.    Jeannett Senior, PA-C 10/21/16 [place], MD 10/22/16 [phone]

## 2016-10-21 NOTE — ED Triage Notes (Signed)
Pt presents to ED for assessment of right knee pain after working out today and then going to work.  Patient denies falls or known injury.  Patient complains that she cannot extend her right knee.

## 2017-01-08 ENCOUNTER — Emergency Department (HOSPITAL_COMMUNITY)
Admission: EM | Admit: 2017-01-08 | Discharge: 2017-01-08 | Disposition: A | Discharge status: Home / Self Care | Payer: No Typology Code available for payment source | Attending: Emergency Medicine | Admitting: Emergency Medicine

## 2017-01-08 ENCOUNTER — Emergency Department (HOSPITAL_COMMUNITY): Payer: No Typology Code available for payment source

## 2017-01-08 ENCOUNTER — Encounter (HOSPITAL_COMMUNITY): Payer: Self-pay | Admitting: *Deleted

## 2017-01-08 DIAGNOSIS — S161XXA Strain of muscle, fascia and tendon at neck level, initial encounter: Secondary | ICD-10-CM | POA: Diagnosis not present

## 2017-01-08 DIAGNOSIS — Y929 Unspecified place or not applicable: Secondary | ICD-10-CM | POA: Insufficient documentation

## 2017-01-08 DIAGNOSIS — Y9389 Activity, other specified: Secondary | ICD-10-CM | POA: Insufficient documentation

## 2017-01-08 DIAGNOSIS — S199XXA Unspecified injury of neck, initial encounter: Secondary | ICD-10-CM | POA: Diagnosis present

## 2017-01-08 DIAGNOSIS — D649 Anemia, unspecified: Secondary | ICD-10-CM | POA: Insufficient documentation

## 2017-01-08 DIAGNOSIS — Z79899 Other long term (current) drug therapy: Secondary | ICD-10-CM | POA: Diagnosis not present

## 2017-01-08 DIAGNOSIS — Y999 Unspecified external cause status: Secondary | ICD-10-CM | POA: Diagnosis not present

## 2017-01-08 DIAGNOSIS — S060X0A Concussion without loss of consciousness, initial encounter: Secondary | ICD-10-CM | POA: Insufficient documentation

## 2017-01-08 DIAGNOSIS — S060X1A Concussion with loss of consciousness of 30 minutes or less, initial encounter: Secondary | ICD-10-CM

## 2017-01-08 DIAGNOSIS — Z87891 Personal history of nicotine dependence: Secondary | ICD-10-CM | POA: Insufficient documentation

## 2017-01-08 MED ORDER — TETANUS-DIPHTH-ACELL PERTUSSIS 5-2.5-18.5 LF-MCG/0.5 IM SUSP
0.5000 mL | Freq: Once | INTRAMUSCULAR | Status: AC
  Administered 2017-01-08: 0.5 mL via INTRAMUSCULAR
  Filled 2017-01-08: qty 0.5

## 2017-01-08 MED ORDER — IBUPROFEN 200 MG PO TABS
600.0000 mg | ORAL_TABLET | Freq: Once | ORAL | Status: AC
  Administered 2017-01-08: 600 mg via ORAL
  Filled 2017-01-08: qty 1

## 2017-01-08 MED ORDER — IBUPROFEN 600 MG PO TABS: 600.0000 mg | ORAL_TABLET | Freq: Four times a day (QID) | ORAL | 0 refills | Status: DC | PRN
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
## 2017-01-08 NOTE — ED Triage Notes (Signed)
PT transported via [facility] by RN to CT scanner#2

## 2018-02-10 ENCOUNTER — Other Ambulatory Visit: Payer: Self-pay

## 2018-02-10 ENCOUNTER — Encounter (HOSPITAL_COMMUNITY): Payer: Self-pay | Admitting: Emergency Medicine

## 2018-02-10 ENCOUNTER — Emergency Department (HOSPITAL_COMMUNITY)
Admission: EM | Admit: 2018-02-10 | Discharge: 2018-02-10 | Disposition: A | Discharge status: Home / Self Care | Payer: Self-pay | Attending: Emergency Medicine | Admitting: Emergency Medicine

## 2018-02-10 ENCOUNTER — Emergency Department (HOSPITAL_COMMUNITY): Payer: Self-pay

## 2018-02-10 DIAGNOSIS — S161XXA Strain of muscle, fascia and tendon at neck level, initial encounter: Secondary | ICD-10-CM | POA: Insufficient documentation

## 2018-02-10 DIAGNOSIS — Z87891 Personal history of nicotine dependence: Secondary | ICD-10-CM | POA: Insufficient documentation

## 2018-02-10 DIAGNOSIS — Y92410 Unspecified street and highway as the place of occurrence of the external cause: Secondary | ICD-10-CM | POA: Insufficient documentation

## 2018-02-10 DIAGNOSIS — Y9389 Activity, other specified: Secondary | ICD-10-CM | POA: Insufficient documentation

## 2018-02-10 DIAGNOSIS — Y999 Unspecified external cause status: Secondary | ICD-10-CM | POA: Insufficient documentation

## 2018-02-10 MED ORDER — CYCLOBENZAPRINE HCL 5 MG PO TABS: 5.0000 mg | ORAL_TABLET | Freq: Two times a day (BID) | ORAL | 0 refills | Status: AC | PRN

## 2018-02-10 MED ORDER — NAPROXEN 500 MG PO TABS: 500.0000 mg | ORAL_TABLET | Freq: Two times a day (BID) | ORAL | 0 refills | Status: AC

## 2018-02-10 NOTE — ED Notes (Signed)
Pt requested copy of x-ray.  Call placed to radiology, spoke with Jana Half.
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
## 2018-02-10 NOTE — Discharge Instructions (Signed)
Do not drive while taking the muscle relaxer as it can make you sleepy. Follow up with your doctor or return here as needed.
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
## 2018-02-13 ENCOUNTER — Encounter (HOSPITAL_COMMUNITY): Payer: Self-pay | Admitting: Emergency Medicine

## 2018-02-13 ENCOUNTER — Emergency Department (HOSPITAL_COMMUNITY)
Admission: EM | Admit: 2018-02-13 | Discharge: 2018-02-13 | Disposition: A | Discharge status: Home / Self Care | Payer: Medicaid Other | Attending: Emergency Medicine | Admitting: Emergency Medicine

## 2018-02-13 ENCOUNTER — Other Ambulatory Visit: Payer: Self-pay

## 2018-02-13 DIAGNOSIS — M545 Low back pain, unspecified: Secondary | ICD-10-CM

## 2018-02-13 DIAGNOSIS — R1030 Lower abdominal pain, unspecified: Secondary | ICD-10-CM | POA: Insufficient documentation

## 2018-02-13 DIAGNOSIS — Z87891 Personal history of nicotine dependence: Secondary | ICD-10-CM | POA: Insufficient documentation

## 2018-02-13 LAB — URINALYSIS, ROUTINE W REFLEX MICROSCOPIC
BACTERIA UA: NONE SEEN
BILIRUBIN URINE: NEGATIVE
Glucose, UA: NEGATIVE mg/dL
Hgb urine dipstick: NEGATIVE
KETONES UR: NEGATIVE mg/dL
LEUKOCYTES UA: NEGATIVE
Nitrite: NEGATIVE
PROTEIN: NEGATIVE mg/dL
Specific Gravity, Urine: 1.025 (ref 1.005–1.030)
pH: 5 (ref 5.0–8.0)

## 2018-02-13 MED ORDER — LIDOCAINE 5 % EX PTCH
1.0000 | MEDICATED_PATCH | CUTANEOUS | Status: DC
Start: 2018-02-13 — End: 2018-02-13
  Administered 2018-02-13: 1 via TRANSDERMAL
  Filled 2018-02-13: qty 1

## 2018-02-13 NOTE — Discharge Instructions (Addendum)
You were seen in the emergency department for back pain.  You do not have a urinary tract infection.  I suspect your pain is from muscular injury or overuse.  You can take over-the-counter 600 mg of ibuprofen every 8 hours and/or 500 mg of acetaminophen every 8 hours for pain.  You have been prescribed previously muscle relaxer which he can use for associated tightness or spasms.  Over-the-counter patches containing lidocaine can be applied to the area for 24 hours at a time to help with pain.  Return to the ER for worsening pain, abdominal pain, urinary symptoms, changes in your bowel movements, groin numbness, loss of bladder or bowel control or retention, loss of sensation weakness or heaviness your extremities.
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
## 2018-02-15 LAB — URINE CULTURE

## 2018-03-23 IMAGING — DX DG KNEE COMPLETE 4+V*R*
4 series · 4 of 4 positions shown · non-contrast
Comparison: None.

CLINICAL DATA: Right knee pain after working out yesterday.

EXAM:
RIGHT KNEE - COMPLETE 4+ VIEW

[knee ap]
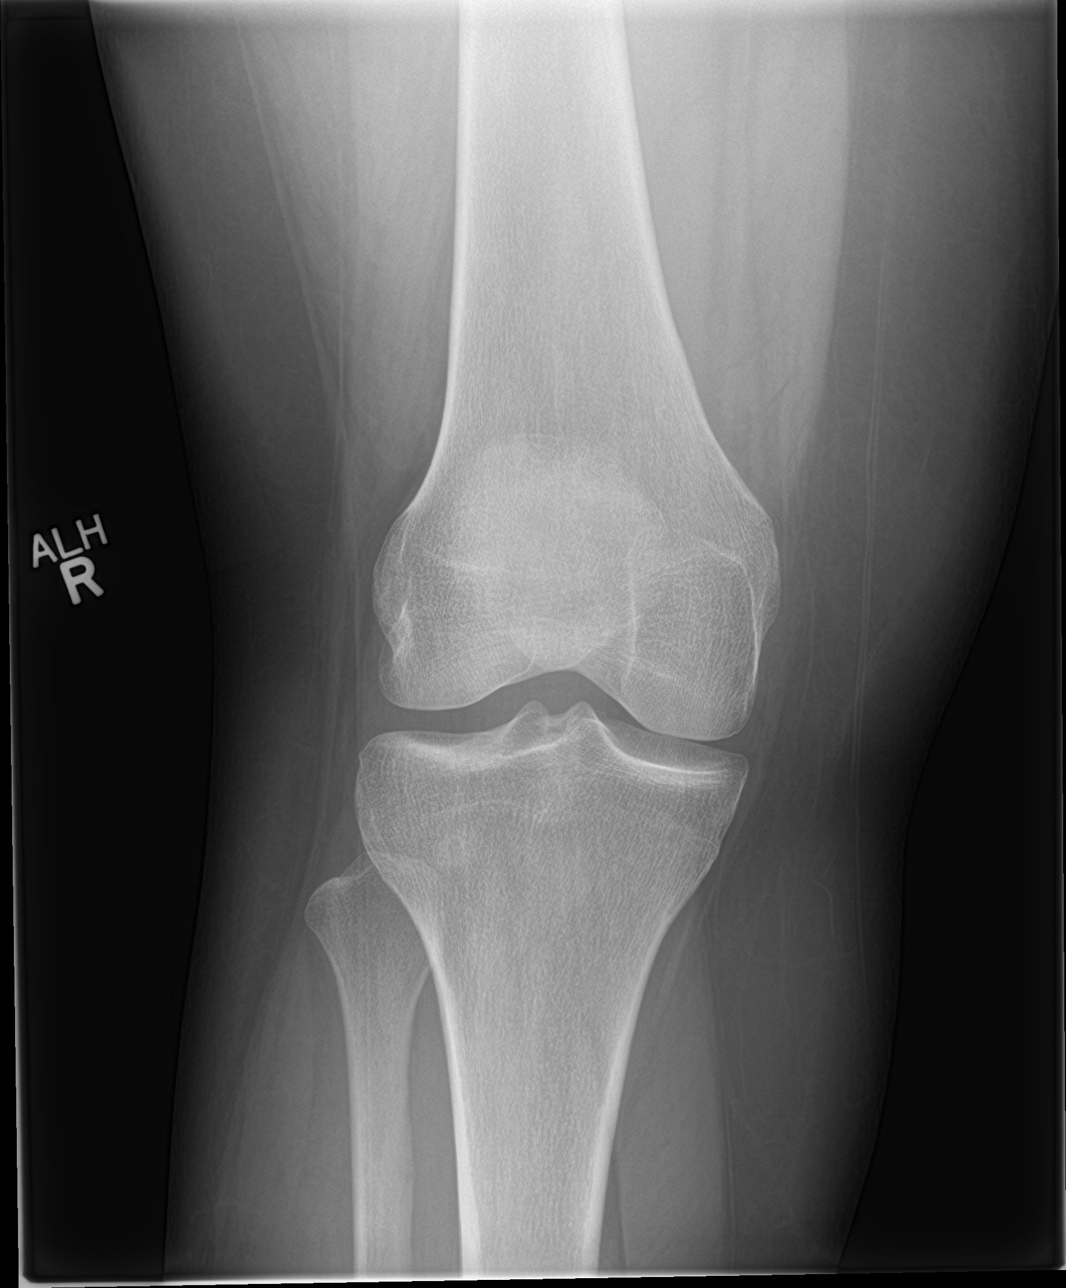

[knee lat]
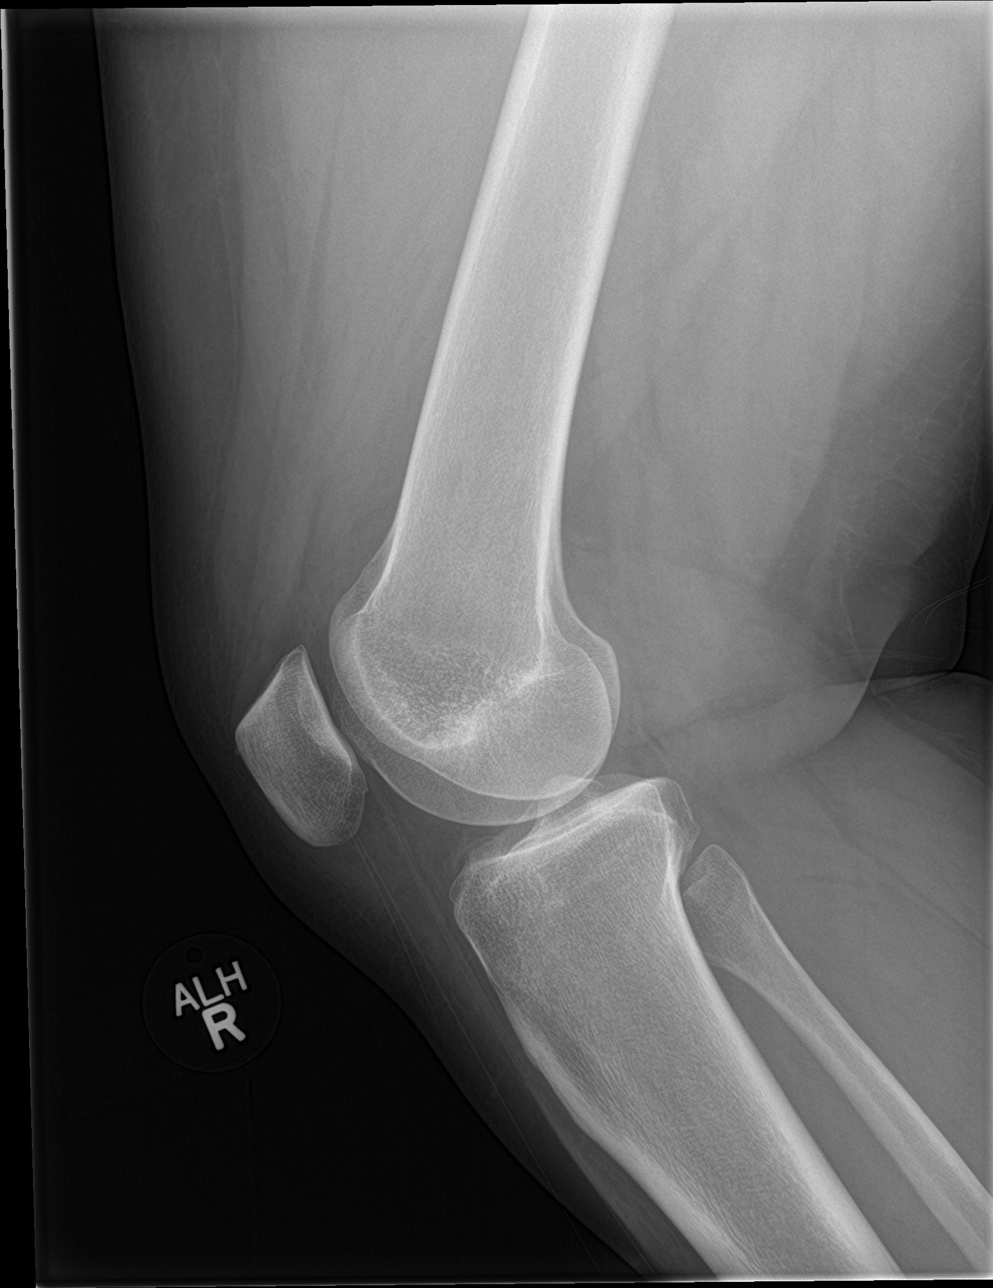

[knee obl (1 of 2)]
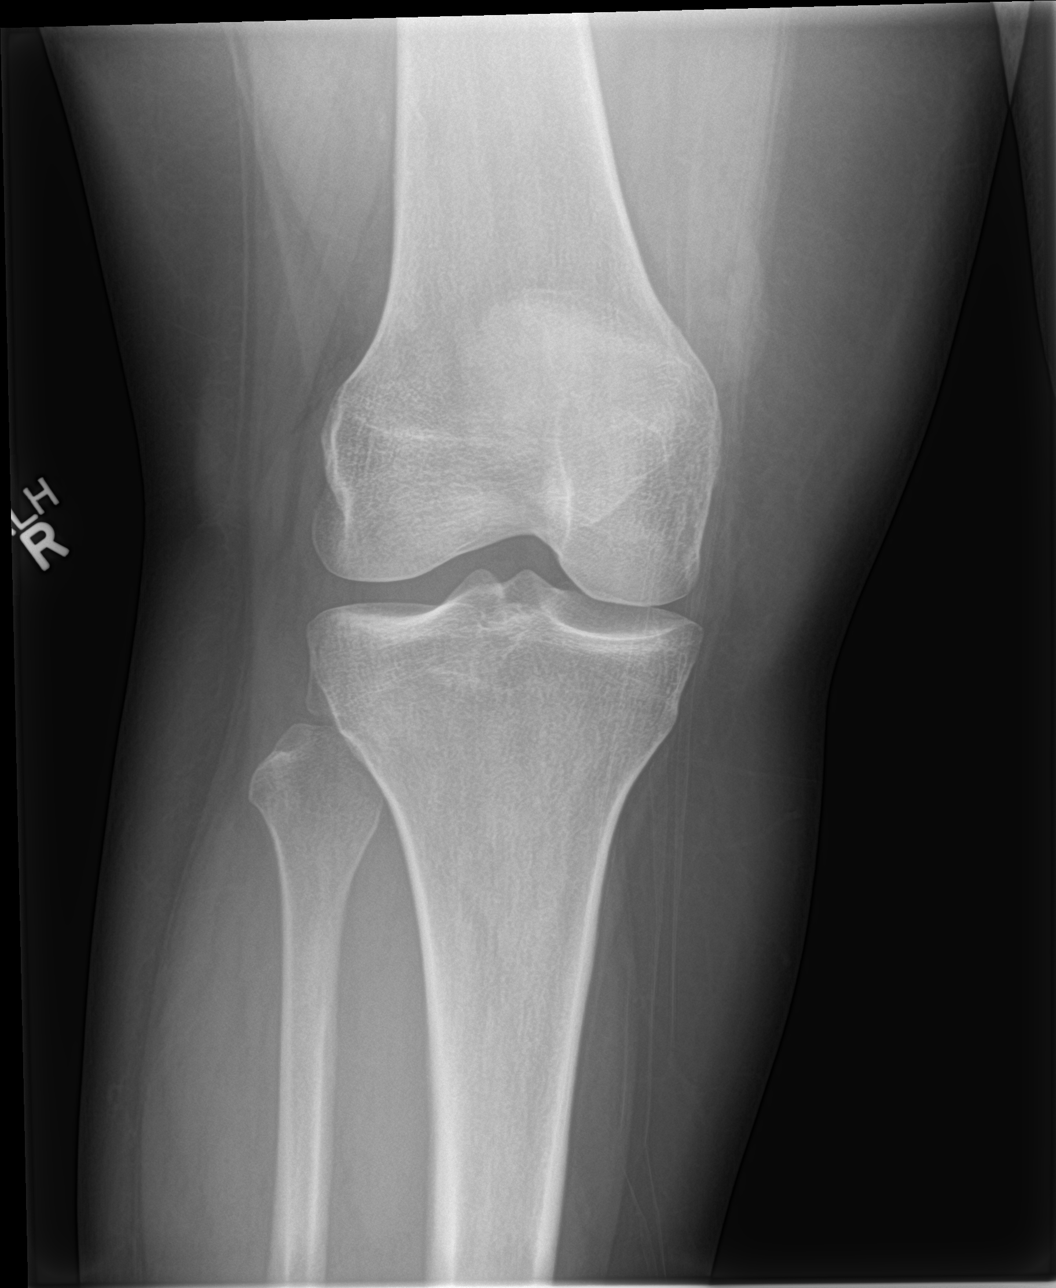

[knee obl (2 of 2)]
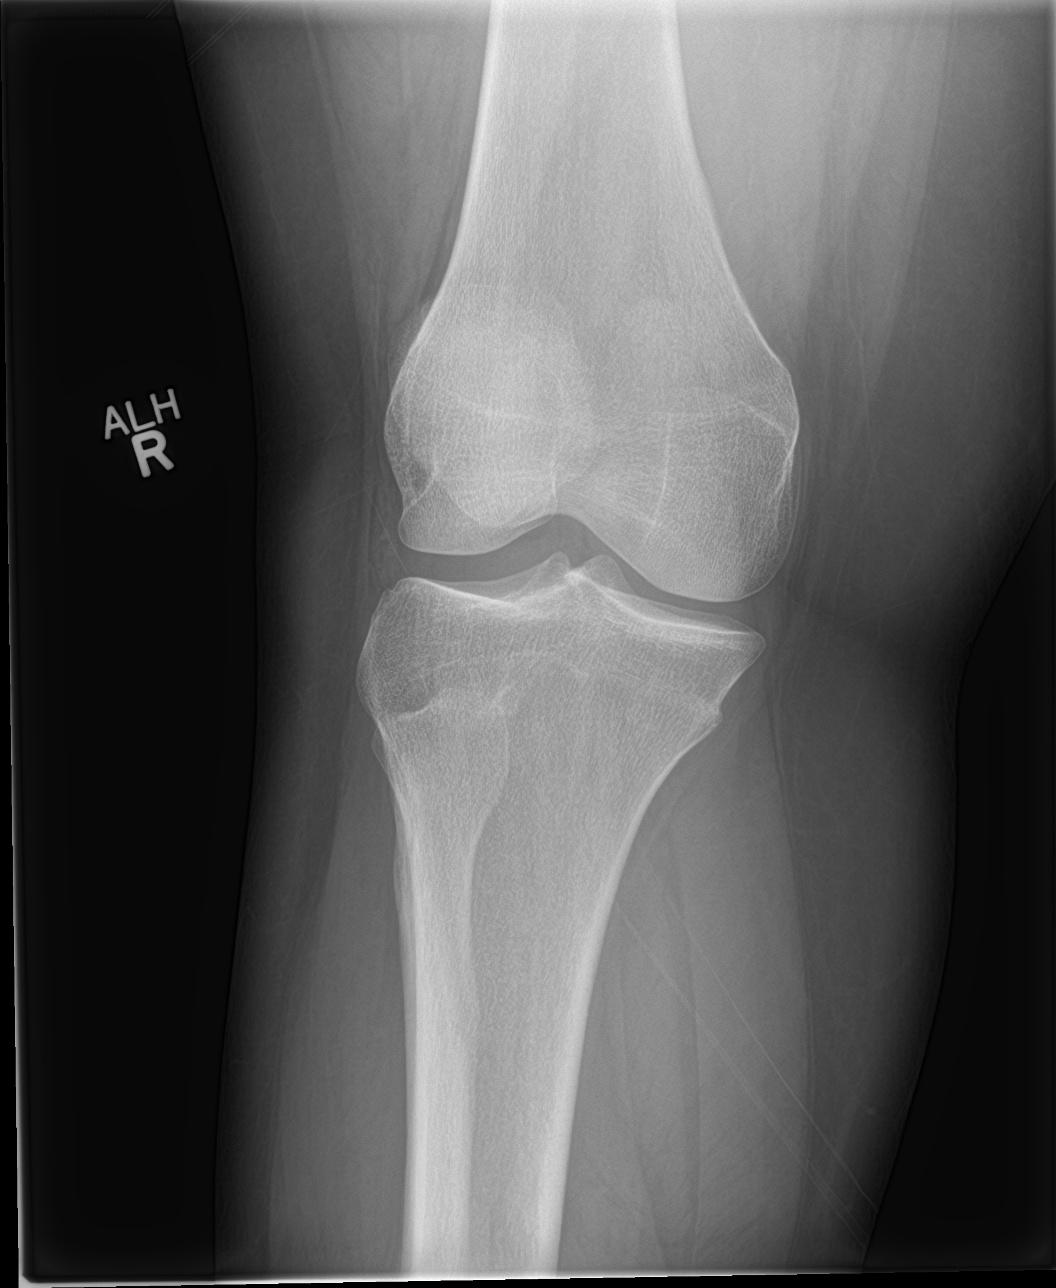

[4 of 4 positions shown; findings below may reference images not displayed]

FINDINGS: No evidence of fracture, dislocation, or joint effusion. No evidence
of arthropathy or other focal bone abnormality. Soft tissues are
unremarkable.
IMPRESSION: Negative.

## 2019-11-12 ENCOUNTER — Ambulatory Visit: Payer: Self-pay | Attending: Internal Medicine

## 2019-11-12 DIAGNOSIS — Z20822 Contact with and (suspected) exposure to covid-19: Secondary | ICD-10-CM

## 2019-11-13 LAB — NOVEL CORONAVIRUS, NAA: SARS-CoV-2, NAA: NOT DETECTED

## 2019-11-13 LAB — SARS-COV-2, NAA 2 DAY TAT

## 2023-03-29 ENCOUNTER — Ambulatory Visit: Payer: Self-pay | Admitting: Physician Assistant

## 2023-05-06 ENCOUNTER — Ambulatory Visit: Payer: Self-pay | Admitting: Family Medicine
